# Patient Record
Sex: Female | Born: 1990 | Race: White | Hispanic: No | Marital: Married | Smoking: Current every day smoker
Health system: Southern US, Community
[De-identification: ages and names within clinical notes are randomized; demographics above are authoritative.]

## PROBLEM LIST (undated history)

## (undated) DIAGNOSIS — J45909 Unspecified asthma, uncomplicated: Secondary | ICD-10-CM

## (undated) HISTORY — PX: NO PAST SURGERIES: SHX2092

---

## 2009-10-19 ENCOUNTER — Emergency Department: Payer: Self-pay | Admitting: Emergency Medicine

## 2010-08-18 ENCOUNTER — Emergency Department: Payer: Self-pay | Admitting: Internal Medicine

## 2010-08-28 ENCOUNTER — Ambulatory Visit: Payer: Self-pay | Admitting: Internal Medicine

## 2010-10-03 ENCOUNTER — Emergency Department: Payer: Self-pay | Admitting: Emergency Medicine

## 2010-12-21 ENCOUNTER — Ambulatory Visit: Payer: Self-pay | Admitting: Internal Medicine

## 2011-09-04 ENCOUNTER — Emergency Department: Payer: Self-pay | Admitting: Emergency Medicine

## 2011-09-04 LAB — CBC
HGB: 11.8 g/dL — ABNORMAL LOW (ref 12.0–16.0)
MCH: 31.6 pg (ref 26.0–34.0)
MCHC: 34 g/dL (ref 32.0–36.0)
MCV: 93 fL (ref 80–100)
RBC: 3.73 10*6/uL — ABNORMAL LOW (ref 3.80–5.20)

## 2011-09-04 LAB — URINALYSIS, COMPLETE
Bilirubin,UR: NEGATIVE
Blood: NEGATIVE
Ketone: NEGATIVE
Ph: 6 (ref 4.5–8.0)
Protein: NEGATIVE
RBC,UR: 3 /HPF (ref 0–5)
Squamous Epithelial: 108

## 2011-09-04 LAB — HCG, QUANTITATIVE, PREGNANCY: Beta Hcg, Quant.: 622 m[IU]/mL — ABNORMAL HIGH

## 2012-04-05 ENCOUNTER — Emergency Department: Payer: Self-pay | Admitting: Emergency Medicine

## 2012-06-13 ENCOUNTER — Emergency Department: Payer: Self-pay | Admitting: Emergency Medicine
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
## 2013-02-09 ENCOUNTER — Inpatient Hospital Stay: Payer: Self-pay | Admitting: Psychiatry

## 2013-02-09 LAB — URINALYSIS, COMPLETE
Bilirubin,UR: NEGATIVE
Glucose,UR: NEGATIVE mg/dL (ref 0–75)
Ph: 6 (ref 4.5–8.0)
Protein: 30
Specific Gravity: 1.029 (ref 1.003–1.030)
Squamous Epithelial: 47

## 2013-02-09 LAB — COMPREHENSIVE METABOLIC PANEL
Albumin: 4 g/dL (ref 3.4–5.0)
Alkaline Phosphatase: 53 U/L
Anion Gap: 7 (ref 7–16)
Bilirubin,Total: 0.4 mg/dL (ref 0.2–1.0)
Calcium, Total: 9.4 mg/dL (ref 8.5–10.1)
Co2: 25 mmol/L (ref 21–32)
EGFR (African American): >60
EGFR (Non-African Amer.): >60
Glucose: 111 mg/dL — ABNORMAL HIGH (ref 65–99)
Potassium: 3.5 mmol/L (ref 3.5–5.1)
SGOT(AST): 25 U/L (ref 15–37)
SGPT (ALT): 24 U/L (ref 12–78)
Total Protein: 7.5 g/dL (ref 6.4–8.2)

## 2013-02-09 LAB — CBC
MCH: 31.2 pg (ref 26.0–34.0)
MCV: 90 fL (ref 80–100)
Platelet: 263 10*3/uL (ref 150–440)
RBC: 4.28 10*6/uL (ref 3.80–5.20)
WBC: 6 10*3/uL (ref 3.6–11.0)

## 2013-02-09 LAB — DRUG SCREEN, URINE
Barbiturates, Ur Screen: NEGATIVE (ref ?–200)
Benzodiazepine, Ur Scrn: POSITIVE (ref ?–200)
Cocaine Metabolite,Ur ~~LOC~~: NEGATIVE (ref ?–300)
MDMA (Ecstasy)Ur Screen: NEGATIVE (ref ?–500)
Methadone, Ur Screen: NEGATIVE (ref ?–300)
Opiate, Ur Screen: POSITIVE (ref ?–300)
Phencyclidine (PCP) Ur S: NEGATIVE (ref ?–25)

## 2013-02-09 LAB — ETHANOL
Ethanol %: <0.003 % (ref 0.000–0.080)
Ethanol: <3 mg/dL

## 2013-02-09 LAB — SALICYLATE LEVEL: Salicylates, Serum: 3.4 mg/dL — ABNORMAL HIGH

## 2013-02-09 LAB — ACETAMINOPHEN LEVEL: Acetaminophen: 2 ug/mL — ABNORMAL LOW

## 2013-02-10 LAB — BEHAVIORAL MEDICINE 1 PANEL
Albumin: 3.5 g/dL (ref 3.4–5.0)
Alkaline Phosphatase: 47 U/L
Anion Gap: 4 — ABNORMAL LOW (ref 7–16)
BUN: 11 mg/dL (ref 7–18)
Basophil #: 0 10*3/uL (ref 0.0–0.1)
Basophil %: 0.4 %
Bilirubin,Total: 0.5 mg/dL (ref 0.2–1.0)
Calcium, Total: 9.1 mg/dL (ref 8.5–10.1)
Chloride: 108 mmol/L — ABNORMAL HIGH (ref 98–107)
Co2: 30 mmol/L (ref 21–32)
Creatinine: 0.68 mg/dL (ref 0.60–1.30)
EGFR (African American): >60
EGFR (Non-African Amer.): >60
Eosinophil #: 0.3 10*3/uL (ref 0.0–0.7)
Eosinophil %: 5.2 %
Glucose: 95 mg/dL (ref 65–99)
HCT: 37.2 % (ref 35.0–47.0)
HGB: 12.8 g/dL (ref 12.0–16.0)
Lymphocyte #: 3 10*3/uL (ref 1.0–3.6)
Lymphocyte %: 45.6 %
MCH: 30.7 pg (ref 26.0–34.0)
MCHC: 34.4 g/dL (ref 32.0–36.0)
MCV: 89 fL (ref 80–100)
Monocyte #: 0.5 x10 3/mm (ref 0.2–0.9)
Monocyte %: 7.4 %
Neutrophil #: 2.7 10*3/uL (ref 1.4–6.5)
Neutrophil %: 41.4 %
Osmolality: 282 (ref 275–301)
Platelet: 225 10*3/uL (ref 150–440)
Potassium: 4 mmol/L (ref 3.5–5.1)
RBC: 4.16 10*6/uL (ref 3.80–5.20)
RDW: 12.7 % (ref 11.5–14.5)
SGOT(AST): 19 U/L (ref 15–37)
SGPT (ALT): 19 U/L (ref 12–78)
Sodium: 142 mmol/L (ref 136–145)
Thyroid Stimulating Horm: 1.34 u[IU]/mL
Total Protein: 6.7 g/dL (ref 6.4–8.2)
WBC: 6.5 10*3/uL (ref 3.6–11.0)

## 2013-03-03 ENCOUNTER — Encounter (HOSPITAL_COMMUNITY): Payer: Self-pay | Admitting: Emergency Medicine

## 2013-03-03 ENCOUNTER — Emergency Department (HOSPITAL_COMMUNITY)
Admission: EM | Admit: 2013-03-03 | Discharge: 2013-03-03 | Disposition: A | Discharge status: Home / Self Care | Payer: Self-pay | Attending: Emergency Medicine | Admitting: Emergency Medicine

## 2013-03-03 DIAGNOSIS — T7421XA Adult sexual abuse, confirmed, initial encounter: Secondary | ICD-10-CM

## 2013-03-03 DIAGNOSIS — F17200 Nicotine dependence, unspecified, uncomplicated: Secondary | ICD-10-CM | POA: Insufficient documentation

## 2013-03-03 DIAGNOSIS — IMO0002 Reserved for concepts with insufficient information to code with codable children: Secondary | ICD-10-CM | POA: Insufficient documentation

## 2013-03-03 DIAGNOSIS — T7491XA Unspecified adult maltreatment, confirmed, initial encounter: Secondary | ICD-10-CM | POA: Insufficient documentation

## 2013-03-03 DIAGNOSIS — T7492XA Unspecified child maltreatment, confirmed, initial encounter: Secondary | ICD-10-CM | POA: Insufficient documentation
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
## 2013-03-03 NOTE — ED Notes (Signed)
SANE Nurse at bedside 
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
## 2013-03-04 ENCOUNTER — Encounter (HOSPITAL_COMMUNITY): Payer: Self-pay | Admitting: Emergency Medicine

## 2013-03-04 ENCOUNTER — Emergency Department (HOSPITAL_COMMUNITY)
Admission: EM | Admit: 2013-03-04 | Discharge: 2013-03-04 | Disposition: A | Discharge status: Home / Self Care | Payer: Self-pay | Attending: Emergency Medicine | Admitting: Emergency Medicine

## 2013-03-04 DIAGNOSIS — S025XXA Fracture of tooth (traumatic), initial encounter for closed fracture: Secondary | ICD-10-CM | POA: Insufficient documentation

## 2013-03-04 DIAGNOSIS — K0889 Other specified disorders of teeth and supporting structures: Secondary | ICD-10-CM

## 2013-03-04 DIAGNOSIS — F17200 Nicotine dependence, unspecified, uncomplicated: Secondary | ICD-10-CM | POA: Insufficient documentation

## 2013-03-04 DIAGNOSIS — IMO0002 Reserved for concepts with insufficient information to code with codable children: Secondary | ICD-10-CM

## 2013-03-04 DIAGNOSIS — Z3202 Encounter for pregnancy test, result negative: Secondary | ICD-10-CM | POA: Insufficient documentation

## 2013-03-04 MED ORDER — LEVONORGESTREL 0.75 MG PO TABS
ORAL_TABLET | ORAL | Status: AC
  Administered 2013-03-05: 18:00:00
  Filled 2013-03-04: qty 2

## 2013-03-04 MED ORDER — TRAMADOL HCL 50 MG PO TABS
50.0000 mg | ORAL_TABLET | Freq: Once | ORAL | Status: AC
  Administered 2013-03-04: 50 mg via ORAL
  Filled 2013-03-04: qty 1

## 2013-03-04 MED ORDER — LEVONORGESTREL 0.75 MG PO TABS
1.5000 mg | ORAL_TABLET | Freq: Once | ORAL | Status: DC
  Filled 2013-03-04: qty 2

## 2013-03-04 MED ORDER — METRONIDAZOLE 500 MG PO TABS
2000.0000 mg | ORAL_TABLET | Freq: Once | ORAL | Status: DC
  Filled 2013-03-04: qty 4

## 2013-03-04 MED ORDER — CEFIXIME 400 MG PO TABS
400.0000 mg | ORAL_TABLET | Freq: Once | ORAL | Status: DC
  Filled 2013-03-04: qty 1

## 2013-03-04 MED ORDER — PENICILLIN V POTASSIUM 500 MG PO TABS: 500.0000 mg | ORAL_TABLET | Freq: Four times a day (QID) | ORAL | Status: DC

## 2013-03-04 MED ORDER — CEFIXIME 400 MG PO TABS
ORAL_TABLET | ORAL | Status: AC
  Administered 2013-03-05: 18:00:00
  Filled 2013-03-04: qty 1

## 2013-03-04 MED ORDER — PROMETHAZINE HCL 25 MG PO TABS
ORAL_TABLET | ORAL | Status: AC
  Administered 2013-03-05: 18:00:00
  Filled 2013-03-04: qty 3

## 2013-03-04 MED ORDER — AZITHROMYCIN 1 G PO PACK
1.0000 g | PACK | Freq: Once | ORAL | Status: DC
  Filled 2013-03-04: qty 1

## 2013-03-04 MED ORDER — AZITHROMYCIN 1 G PO PACK
PACK | ORAL | Status: AC
  Filled 2013-03-04: qty 1

## 2013-03-04 MED ORDER — HYDROCODONE-ACETAMINOPHEN 5-325 MG PO TABS: 1.0000 | ORAL_TABLET | ORAL | Status: DC | PRN

## 2013-03-04 MED ORDER — PROMETHAZINE HCL 25 MG PO TABS: 25.0000 mg | ORAL_TABLET | Freq: Four times a day (QID) | ORAL | Status: DC | PRN

## 2013-03-04 MED ORDER — PROMETHAZINE HCL 25 MG PO TABS
25.0000 mg | ORAL_TABLET | Freq: Four times a day (QID) | ORAL | Status: DC | PRN
  Filled 2013-03-04: qty 1

## 2013-03-04 MED ORDER — METRONIDAZOLE 500 MG PO TABS
ORAL_TABLET | ORAL | Status: AC
  Administered 2013-03-05: 18:00:00
  Filled 2013-03-04: qty 4
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
## 2013-03-04 NOTE — ED Notes (Signed)
SANE RN at the bedside with patient.
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
## 2013-03-04 NOTE — ED Notes (Signed)
Pt becoming agitated and asking for the nurse. Offered to assist patient. She wants to know how much longer it would be until the sane nurse arrived. Sane RN repaged. States she's in the building and will be down shortly. Pt updated on plan of care. Calm/cooperative at present.

## 2013-03-04 NOTE — ED Notes (Signed)
Notified Psychologist, clinicalane RN
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
## 2013-03-04 NOTE — ED Notes (Signed)
SANE RN paged and notified pt is here.

## 2013-03-04 NOTE — ED Notes (Signed)
SANE RNs given sane med kit pyxis.  

## 2013-03-04 NOTE — ED Provider Notes (Signed)
Medical screening examination/treatment/procedure(s) were performed by non-physician practitioner and as supervising physician I was immediately available for consultation/collaboration.  EKG Interpretation   None        Ilhan Debenedetto K Marylon Verno-Rasch, MD 03/04/13 [id number]
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
## 2013-03-05 NOTE — ED Provider Notes (Signed)
Medical screening examination/treatment/procedure(s) were performed by non-physician practitioner and as supervising physician I was immediately available for consultation/collaboration.  EKG Interpretation   None         Shanna CiscoMegan E Docherty, MD 03/05/13 1110

## 2013-03-08 LAB — POCT PREGNANCY, URINE: PREG TEST UR: NEGATIVE
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
## 2014-01-20 ENCOUNTER — Emergency Department: Payer: Self-pay | Admitting: Emergency Medicine

## 2014-05-05 ENCOUNTER — Emergency Department: Payer: Self-pay | Admitting: Emergency Medicine
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
## 2016-05-15 ENCOUNTER — Emergency Department
Admission: EM | Admit: 2016-05-15 | Discharge: 2016-05-15 | Disposition: A | Discharge status: Home / Self Care | Payer: Self-pay | Attending: Emergency Medicine | Admitting: Emergency Medicine

## 2016-05-15 ENCOUNTER — Encounter: Payer: Self-pay | Admitting: *Deleted

## 2016-05-15 DIAGNOSIS — J111 Influenza due to unidentified influenza virus with other respiratory manifestations: Secondary | ICD-10-CM | POA: Insufficient documentation

## 2016-05-15 DIAGNOSIS — F17210 Nicotine dependence, cigarettes, uncomplicated: Secondary | ICD-10-CM | POA: Insufficient documentation

## 2016-05-15 MED ORDER — ONDANSETRON 4 MG PO TBDP: 4.0000 mg | ORAL_TABLET | Freq: Four times a day (QID) | ORAL | 0 refills | Status: DC | PRN

## 2016-05-15 MED ORDER — FLUTICASONE PROPIONATE 50 MCG/ACT NA SUSP: 2.0000 | Freq: Every day | NASAL | 0 refills | Status: DC

## 2016-05-15 MED ORDER — ACETAMINOPHEN-CODEINE #3 300-30 MG PO TABS: 1.0000 | ORAL_TABLET | Freq: Four times a day (QID) | ORAL | 0 refills | Status: DC | PRN

## 2016-05-15 MED ORDER — BENZONATATE 100 MG PO CAPS: ORAL_CAPSULE | ORAL | 0 refills | Status: DC

## 2016-05-15 NOTE — ED Triage Notes (Addendum)
Patient c/o fever, vomiting, coughing, general body aches since yesterday. Patient reports temp 102 at 2200 last night. Patient reports taken 800 IBU PO x3 since last night.

## 2016-05-15 NOTE — Discharge Instructions (Signed)
Your symptoms are consistent with influenza (the flu). You should continue to treat fevers with OTC Tylenol and Motrin. Take OTC Delsym (dextromethorphan) for cough, Claritin for runny nose; pseudoephedrine for sinus congestion; and the prescription meds as directed. Continue to hydrate to prevent dehydration. [facility] or return for worsening symptoms.

## 2016-05-15 NOTE — ED Provider Notes (Signed)
[facility] Emergency Department Provider Note ____________________________________________  Time seen: 1309  I have reviewed the triage vital signs and the nursing notes.  HISTORY  Chief Complaint  Fever  HPI Kendra Lindsey is a 26 y.o. female presents to the ED with complaints of fevers, coughing, generalized bodyaches, and vomiting since yesterday. Patient also reports a Tmax of 102F last night. She has been taking ibuprofen on schedule since onset of fevers last night. She does notendorse receiving the seasonal flu vaccine. She does admit however to sick contacts with influenza. She presents now for evaluation of flulike symptoms.  History reviewed. No pertinent past medical history.  There are no active problems to display for this patient.  History reviewed. No pertinent surgical history.  Prior to Admission medications   Medication Sig Start Date End Date Taking? Authorizing Provider  acetaminophen-codeine (TYLENOL #3) 300-30 MG tablet Take 1 tablet by mouth every 6 (six) hours as needed for moderate pain. 05/15/16   Yovana Scogin V Bacon Yvett Rossel, PA-C  benzonatate (TESSALON PERLES) 100 MG capsule Take 1-2 tabs TID prn cough 05/15/16   Florian Chauca V Bacon Janecia Palau, PA-C  fluticasone (FLONASE) 50 MCG/ACT nasal spray Place 2 sprays into both nostrils daily. 05/15/16   Daxten Kovalenko V Bacon Zyon Rosser, PA-C  HYDROcodone-acetaminophen (NORCO/VICODIN) 5-325 MG per tablet Take 1-2 tablets by mouth every 4 (four) hours as needed. 03/04/13   Junious Silk, PA-C  ondansetron (ZOFRAN ODT) 4 MG disintegrating tablet Take 1 tablet (4 mg total) by mouth every 6 (six) hours as needed for nausea or vomiting. 05/15/16   Charlesetta Ivory Treysen Sudbeck, PA-C  penicillin v potassium (VEETID) 500 MG tablet Take 1 tablet (500 mg total) by mouth 4 (four) times daily. 03/04/13   Junious Silk, PA-C  promethazine (PHENERGAN) 25 MG tablet Take 1 tablet (25 mg total) by mouth every 6 (six) hours as needed for nausea  or vomiting. 03/04/13   Junious Silk, PA-C    Allergies Patient has no known allergies.  No family history on file.  Social History Social History  Substance Use Topics  . Smoking status: Current Every Day Smoker    Packs/day: 0.25    Types: Cigarettes  . Smokeless tobacco: Never Used  . Alcohol use Yes     Comment: socially    Review of Systems  Constitutional: Positive for fever. Eyes: Negative for visual changes. ENT: Negative for sore throat. Cardiovascular: Negative for chest pain. Respiratory: Negative for shortness of breath. Gastrointestinal: Negative for abdominal pain and diarrhea. Reports vomiting. Genitourinary: Negative for dysuria. Musculoskeletal: Negative for back pain. Reports generalized bodyaches. Skin: Negative for rash. Neurological: Negative for headaches, focal weakness or numbness. ____________________________________________  PHYSICAL EXAM:  VITAL SIGNS: ED Triage Vitals  Enc Vitals Group     BP 05/15/16 1308 136/80     Pulse Rate 05/15/16 1308 (!) 103     Resp 05/15/16 1308 18     Temp 05/15/16 1308 98.6 F (37 C)     Temp Source 05/15/16 1308 Oral     SpO2 05/15/16 1308 98 %     Weight 05/15/16 1311 200 lb (90.7 kg)     Height 05/15/16 1311 5\' 3"  (1.6 m)     Head Circumference --      Peak Flow --      Pain Score 05/15/16 1311 9     Pain Loc --      Pain Edu? --      Excl. in GC? --  Constitutional: Alert and oriented. Well appearing and in no distress. Head: Normocephalic and atraumatic. Eyes: Conjunctivae are normal. PERRL. Normal extraocular movements Ears: Canals clear. TMs intact bilaterally. Nose: No congestion/rhinorrhea/epistaxis. Mouth/Throat: Mucous membranes are moist. Uvula is midline and tonsils are flat. No oropharyngeal lesions, erythema, or edema noted.  Neck: Supple. No thyromegaly. Hematological/Lymphatic/Immunological: No cervical lymphadenopathy. Cardiovascular: Normal rate, regular rhythm. Normal distal  pulses. Respiratory: Normal respiratory effort. No wheezes/rales/rhonchi. Gastrointestinal: Soft and nontender. No distention. Musculoskeletal: Nontender with normal range of motion in all extremities.  ____________________________________________  INITIAL IMPRESSION / ASSESSMENT AND PLAN / ED COURSE  Patient with a clinical presentation consistent with influenza. There are no signs of dehydration or acute respiratory distress. She will be discharged with prescriptions for Tylenol #3, Tessalon Perles, Flonase, and Zofran ODT. She is advised to start OTC allergy medicine, decongestants, and continued antipyretics. [facility] or return to the ED as needed. ____________________________________________  FINAL CLINICAL IMPRESSION(S) / ED DIAGNOSES  Final diagnoses:  Influenza      Kendra HoardJenise V Bacon Kendra Coltrane, PA-C 05/15/16 1736    Minna AntisKevin Paduchowski, MD 05/16/16 2044

## 2016-06-05 ENCOUNTER — Ambulatory Visit
Admission: EM | Admit: 2016-06-05 | Discharge: 2016-06-05 | Disposition: A | Discharge status: Home / Self Care | Payer: Self-pay | Attending: Family Medicine | Admitting: Family Medicine

## 2016-06-05 ENCOUNTER — Encounter: Payer: Self-pay | Admitting: *Deleted

## 2016-06-05 DIAGNOSIS — H6693 Otitis media, unspecified, bilateral: Secondary | ICD-10-CM

## 2016-06-05 DIAGNOSIS — R059 Cough, unspecified: Secondary | ICD-10-CM

## 2016-06-05 DIAGNOSIS — B9689 Other specified bacterial agents as the cause of diseases classified elsewhere: Secondary | ICD-10-CM

## 2016-06-05 DIAGNOSIS — R05 Cough: Secondary | ICD-10-CM

## 2016-06-05 DIAGNOSIS — N760 Acute vaginitis: Secondary | ICD-10-CM

## 2016-06-05 LAB — CHLAMYDIA/NGC RT PCR (ARMC ONLY)
CHLAMYDIA TR: NOT DETECTED
N gonorrhoeae: NOT DETECTED

## 2016-06-05 LAB — WET PREP, GENITAL
Sperm: NONE SEEN
Trich, Wet Prep: NONE SEEN
Yeast Wet Prep HPF POC: NONE SEEN

## 2016-06-05 MED ORDER — AZITHROMYCIN 250 MG PO TABS: ORAL_TABLET | ORAL | 0 refills | Status: DC

## 2016-06-05 MED ORDER — HYDROCOD POLST-CPM POLST ER 10-8 MG/5ML PO SUER: 5.0000 mL | Freq: Two times a day (BID) | ORAL | 0 refills | Status: DC | PRN

## 2016-06-05 MED ORDER — METRONIDAZOLE 500 MG PO TABS: 500.0000 mg | ORAL_TABLET | Freq: Two times a day (BID) | ORAL | 0 refills | Status: DC

## 2016-06-05 NOTE — ED Triage Notes (Signed)
Productive cough- green, fever, runny nose, head congestion, body aches, x several days. OTC meds not helping. Also, pt c/o vaginal discharge, denies dysuria.

## 2016-06-05 NOTE — ED Provider Notes (Signed)
[facility]    CSN: [id number] Arrival date & time: 06/05/16  1110     History   Chief Complaint Chief Complaint  Patient presents with  . Cough  . Fever  . Nasal Congestion  . Vaginal Discharge    HPI Kendra Lindsey is a 26 y.o. female.   The history is provided by the patient.  Cough  Associated symptoms: fever and rhinorrhea   Associated symptoms: no headaches and no wheezing   Fever  Associated symptoms: congestion, cough and rhinorrhea   Associated symptoms: no dysuria, no headaches, no nausea and no vomiting   Vaginal Discharge  Quality:  White and thick Severity:  Moderate Onset quality:  Sudden Duration:  1 week Timing:  Constant Progression:  Unchanged Chronicity:  New Context: spontaneously   Context: not after intercourse, not after urination, not at rest, not during pregnancy, not during urination, not genital trauma and not recent antibiotic use   Relieved by:  None tried Associated symptoms: fever   Associated symptoms: no abdominal pain, no dyspareunia, no dysuria, no genital lesions, no nausea, no rash, no urinary frequency, no urinary hesitancy, no urinary incontinence, no vaginal itching and no vomiting   Risk factors: no foreign body   URI  Presenting symptoms: congestion, cough, fever and rhinorrhea   Severity:  Moderate Onset quality:  Sudden Duration:  5 days Timing:  Constant Progression:  Worsening Chronicity:  New Relieved by:  Nothing Ineffective treatments:  OTC medications Associated symptoms: no headaches, no sinus pain and no wheezing   Risk factors: not elderly, no chronic cardiac disease, no chronic kidney disease, no chronic respiratory disease, no diabetes mellitus, no immunosuppression, no recent illness, no recent travel and no sick contacts     History reviewed. No pertinent past medical history.  There are no active problems to display for this patient.   History reviewed. No pertinent surgical  history.  OB History    No data available       Home Medications    Prior to Admission medications   Medication Sig Start Date End Date Taking? Authorizing Provider  acetaminophen-codeine (TYLENOL #3) 300-30 MG tablet Take 1 tablet by mouth every 6 (six) hours as needed for moderate pain. 05/15/16   Jenise V Bacon Menshew, PA-C  azithromycin (ZITHROMAX Z-PAK) 250 MG tablet 2 tabs po once day 1, then 1 tab po qd for next 4 days 06/05/16   Payton Mccallum, MD  azithromycin (ZITHROMAX Z-PAK) 250 MG tablet 2 tabs po once day 1, then 1 tab po qd for next 4 days 06/05/16   Payton Mccallum, MD  benzonatate (TESSALON PERLES) 100 MG capsule Take 1-2 tabs TID prn cough 05/15/16   Jenise V Bacon Menshew, PA-C  chlorpheniramine-HYDROcodone (TUSSIONEX PENNKINETIC ER) 10-8 MG/5ML SUER Take 5 mLs by mouth every 12 (twelve) hours as needed. 06/05/16   Payton Mccallum, MD  chlorpheniramine-HYDROcodone (TUSSIONEX PENNKINETIC ER) 10-8 MG/5ML SUER Take 5 mLs by mouth every 12 (twelve) hours as needed. 06/05/16   Payton Mccallum, MD  fluticasone (FLONASE) 50 MCG/ACT nasal spray Place 2 sprays into both nostrils daily. 05/15/16   Jenise V Bacon Menshew, PA-C  HYDROcodone-acetaminophen (NORCO/VICODIN) 5-325 MG per tablet Take 1-2 tablets by mouth every 4 (four) hours as needed. 03/04/13   Junious Silk, PA-C  metroNIDAZOLE (FLAGYL) 500 MG tablet Take 1 tablet (500 mg total) by mouth 2 (two) times daily. 06/05/16   Payton Mccallum, MD  ondansetron (ZOFRAN ODT) 4 MG disintegrating tablet Take 1  tablet (4 mg total) by mouth every 6 (six) hours as needed for nausea or vomiting. 05/15/16   Charlesetta Ivory Menshew, PA-C  penicillin v potassium (VEETID) 500 MG tablet Take 1 tablet (500 mg total) by mouth 4 (four) times daily. 03/04/13   Junious Silk, PA-C  promethazine (PHENERGAN) 25 MG tablet Take 1 tablet (25 mg total) by mouth every 6 (six) hours as needed for nausea or vomiting. 03/04/13   Junious Silk, PA-C    Family History History  reviewed. No pertinent family history.  Social History Social History  Substance Use Topics  . Smoking status: Current Every Day Smoker    Packs/day: 0.25    Types: Cigarettes  . Smokeless tobacco: Never Used  . Alcohol use Yes     Comment: socially     Allergies   Patient has no known allergies.   Review of Systems Review of Systems  Constitutional: Positive for fever.  HENT: Positive for congestion and rhinorrhea. Negative for sinus pain.   Respiratory: Positive for cough. Negative for wheezing.   Gastrointestinal: Negative for abdominal pain, nausea and vomiting.  Genitourinary: Positive for vaginal discharge. Negative for bladder incontinence, dyspareunia, dysuria and hesitancy.  Neurological: Negative for headaches.     Physical Exam Triage Vital Signs ED Triage Vitals  Enc Vitals Group     BP 06/05/16 1133 133/85     Pulse Rate 06/05/16 1133 98     Resp 06/05/16 1133 16     Temp 06/05/16 1133 99.4 F (37.4 C)     Temp Source 06/05/16 1133 Oral     SpO2 06/05/16 1133 99 %     Weight 06/05/16 1135 200 lb (90.7 kg)     Height 06/05/16 1135  (1.6 m)     Head Circumference --      Peak Flow --      Pain Score 06/05/16 1301 9     Pain Loc --      Pain Edu? --      Excl. in GC? --    No data found.   Updated Vital Signs BP 133/85 (BP Location: Left Arm)   Pulse 98   Temp 99.4 F (37.4 C) (Oral)   Resp 16   Ht  (1.6 m)   Wt 200 lb (90.7 kg)   LMP 05/04/2016 (Exact Date)   SpO2 99%   BMI 35.43 kg/m   Visual Acuity Right Eye Distance:   Left Eye Distance:   Bilateral Distance:    Right Eye Near:   Left Eye Near:    Bilateral Near:     Physical Exam  Constitutional: She appears well-developed and well-nourished. No distress.  HENT:  Head: Normocephalic and atraumatic.  Right Ear: External ear and ear canal normal. Tympanic membrane is erythematous and bulging.  Left Ear: External ear and ear canal normal. Tympanic membrane is  erythematous and bulging.  Nose: No mucosal edema, rhinorrhea, nose lacerations, sinus tenderness, nasal deformity, septal deviation or nasal septal hematoma. No epistaxis.  No foreign bodies. Right sinus exhibits no maxillary sinus tenderness and no frontal sinus tenderness. Left sinus exhibits no maxillary sinus tenderness and no frontal sinus tenderness.  Mouth/Throat: Uvula is midline, oropharynx is clear and moist and mucous membranes are normal. No oropharyngeal exudate.  Eyes: Conjunctivae and EOM are normal. Pupils are equal, round, and reactive to light. Right eye exhibits no discharge. Left eye exhibits no discharge. No scleral icterus.  Neck: Normal range of motion. Neck supple.  No thyromegaly present.  Cardiovascular: Normal rate, regular rhythm and normal heart sounds.   Pulmonary/Chest: Effort normal and breath sounds normal. No respiratory distress. She has no wheezes. She has no rales.  Genitourinary: Pelvic exam was performed with patient supine. Cervix exhibits no discharge and no friability. Vaginal discharge found.  Lymphadenopathy:    She has no cervical adenopathy.  Skin: She is not diaphoretic.  Nursing note and vitals reviewed.    UC Treatments / Results  Labs (all labs ordered are listed, but only abnormal results are displayed) Labs Reviewed  WET PREP, GENITAL - Abnormal; Notable for the following:       Result Value   Clue Cells Wet Prep HPF POC PRESENT (*)    WBC, Wet Prep HPF POC MODERATE (*)    All other components within normal limits  CHLAMYDIA/NGC RT PCR [facility] ONLY)    EKG  EKG Interpretation None       Radiology No results found.  Procedures Procedures (including critical care time)  Medications Ordered in UC Medications - No data to display   Initial Impression / Assessment and Plan / UC Course  I have reviewed the triage vital signs and the nursing notes.  Pertinent labs & imaging results that were available during my care  of the patient were reviewed by me and considered in my medical decision making (see chart for details).       Final Clinical Impressions(s) / UC Diagnoses   Final diagnoses:  BV (bacterial vaginosis)  Acute otitis media, bilateral  Cough    New Prescriptions Discharge Medication List as of 06/05/2016 12:52 PM    START taking these medications   Details  !! chlorpheniramine-HYDROcodone (TUSSIONEX PENNKINETIC ER) 10-8 MG/5ML SUER Take 5 mLs by mouth every 12 (twelve) hours as needed., Starting Wed 06/05/2016, Normal    !! chlorpheniramine-HYDROcodone (TUSSIONEX PENNKINETIC ER) 10-8 MG/5ML SUER Take 5 mLs by mouth every 12 (twelve) hours as needed., Starting Wed 06/05/2016, Normal    metroNIDAZOLE (FLAGYL) 500 MG tablet Take 1 tablet (500 mg total) by mouth 2 (two) times daily., Starting Wed 06/05/2016, Normal     !! - Potential duplicate medications found. Please discuss with provider.     1. Lab results and diagnosis reviewed with patient 2. rx as per orders above; reviewed possible side effects, interactions, risks and benefits  3. Recommend supportive treatment with increased fluids, otc analgesics prn 4. Follow-up prn if symptoms worsen or don't improve   Payton Mccallum, MD 06/05/16 1800

## 2016-06-06 ENCOUNTER — Telehealth: Payer: Self-pay

## 2016-06-06 NOTE — Telephone Encounter (Signed)
Pt was informed of negative test results 

## 2016-06-06 NOTE — Telephone Encounter (Signed)
-----   Message from Payton Mccallum, MD sent at 06/05/2016  8:43 PM EDT ----- Notify patient tests were negative

## 2016-06-06 NOTE — Telephone Encounter (Signed)
Called to talk with patient about test results, voicemail hasn't been set up and other number is someone else's.

## 2016-11-15 ENCOUNTER — Emergency Department: Payer: Self-pay

## 2016-11-15 ENCOUNTER — Emergency Department
Admission: EM | Admit: 2016-11-15 | Discharge: 2016-11-15 | Disposition: A | Discharge status: Home / Self Care | Payer: Self-pay | Attending: Emergency Medicine | Admitting: Emergency Medicine

## 2016-11-15 ENCOUNTER — Encounter: Payer: Self-pay | Admitting: Emergency Medicine

## 2016-11-15 DIAGNOSIS — N898 Other specified noninflammatory disorders of vagina: Secondary | ICD-10-CM | POA: Insufficient documentation

## 2016-11-15 DIAGNOSIS — R509 Fever, unspecified: Secondary | ICD-10-CM | POA: Insufficient documentation

## 2016-11-15 DIAGNOSIS — R102 Pelvic and perineal pain: Secondary | ICD-10-CM | POA: Insufficient documentation

## 2016-11-15 DIAGNOSIS — H9203 Otalgia, bilateral: Secondary | ICD-10-CM | POA: Insufficient documentation

## 2016-11-15 DIAGNOSIS — R197 Diarrhea, unspecified: Secondary | ICD-10-CM | POA: Insufficient documentation

## 2016-11-15 DIAGNOSIS — R112 Nausea with vomiting, unspecified: Secondary | ICD-10-CM | POA: Insufficient documentation

## 2016-11-15 LAB — CBC
HCT: 37.2 % (ref 35.0–47.0)
Hemoglobin: 13 g/dL (ref 12.0–16.0)
MCH: 31.8 pg (ref 26.0–34.0)
MCHC: 35 g/dL (ref 32.0–36.0)
MCV: 90.8 fL (ref 80.0–100.0)
Platelets: 270 10*3/uL (ref 150–440)
RBC: 4.1 MIL/uL (ref 3.80–5.20)
RDW: 13.4 % (ref 11.5–14.5)
WBC: 7.2 10*3/uL (ref 3.6–11.0)

## 2016-11-15 LAB — COMPREHENSIVE METABOLIC PANEL
ALBUMIN: 4 g/dL (ref 3.5–5.0)
ALK PHOS: 46 U/L (ref 38–126)
ALT: 23 U/L (ref 14–54)
ANION GAP: 7 (ref 5–15)
AST: 27 U/L (ref 15–41)
BILIRUBIN TOTAL: 0.6 mg/dL (ref 0.3–1.2)
BUN: 14 mg/dL (ref 6–20)
CALCIUM: 9.1 mg/dL (ref 8.9–10.3)
CO2: 26 mmol/L (ref 22–32)
CREATININE: 0.77 mg/dL (ref 0.44–1.00)
Chloride: 108 mmol/L (ref 101–111)
GFR calc Af Amer: >60 mL/min (ref >60)
GFR calc non Af Amer: >60 mL/min (ref >60)
GLUCOSE: 90 mg/dL (ref 65–99)
Potassium: 3.5 mmol/L (ref 3.5–5.1)
Sodium: 141 mmol/L (ref 135–145)
TOTAL PROTEIN: 7.2 g/dL (ref 6.5–8.1)

## 2016-11-15 LAB — POCT PREGNANCY, URINE: PREG TEST UR: NEGATIVE

## 2016-11-15 LAB — LIPASE, BLOOD: Lipase: 23 U/L (ref 11–51)

## 2016-11-15 MED ORDER — ONDANSETRON 4 MG PO TBDP
4.0000 mg | ORAL_TABLET | Freq: Once | ORAL | Status: AC | PRN
  Administered 2016-11-15: 4 mg via ORAL
  Filled 2016-11-15: qty 1

## 2016-11-15 NOTE — ED Triage Notes (Addendum)
1) Pt c/o cough x1 month, had URI x 3 months ago.  Pt c/o fevers and bilateral ear pain. Pt states she has sinus pressure and productive cough.  Pt has mask on.  Fever at home 101. Took Ibuprofen  at 10am.  Shortness of breath reported during coughing spells.  2) Pt also c/o diarrhea and vomiting x 2 days. Pt states she is unable to keep liquids down. Pt states she is having constant nausea.  3) Pt also c/o vaginal pain, pt had BV pt c/o vaginal discharge as well with foul odor. Pt states she recent unprotected sex with ex-husband.  Pt is here with her girlfriend.

## 2016-11-16 ENCOUNTER — Encounter: Payer: Self-pay | Admitting: Emergency Medicine

## 2016-11-16 ENCOUNTER — Emergency Department
Admission: EM | Admit: 2016-11-16 | Discharge: 2016-11-16 | Discharge status: Against Medical Advice | Payer: Self-pay | Attending: Emergency Medicine | Admitting: Emergency Medicine

## 2016-11-16 DIAGNOSIS — R198 Other specified symptoms and signs involving the digestive system and abdomen: Secondary | ICD-10-CM | POA: Insufficient documentation

## 2016-11-16 DIAGNOSIS — R519 Headache, unspecified: Secondary | ICD-10-CM

## 2016-11-16 DIAGNOSIS — R39198 Other difficulties with micturition: Secondary | ICD-10-CM | POA: Insufficient documentation

## 2016-11-16 DIAGNOSIS — F17210 Nicotine dependence, cigarettes, uncomplicated: Secondary | ICD-10-CM | POA: Insufficient documentation

## 2016-11-16 DIAGNOSIS — R51 Headache: Secondary | ICD-10-CM | POA: Insufficient documentation

## 2016-11-16 DIAGNOSIS — Z79899 Other long term (current) drug therapy: Secondary | ICD-10-CM | POA: Insufficient documentation

## 2016-11-16 DIAGNOSIS — J40 Bronchitis, not specified as acute or chronic: Secondary | ICD-10-CM | POA: Insufficient documentation

## 2016-11-16 DIAGNOSIS — N760 Acute vaginitis: Secondary | ICD-10-CM | POA: Insufficient documentation

## 2016-11-16 LAB — CHLAMYDIA/NGC RT PCR (ARMC ONLY)
CHLAMYDIA TR: NOT DETECTED
N GONORRHOEAE: NOT DETECTED

## 2016-11-16 LAB — WET PREP, GENITAL
Clue Cells Wet Prep HPF POC: NONE SEEN
SPERM: NONE SEEN
TRICH WET PREP: NONE SEEN
Yeast Wet Prep HPF POC: NONE SEEN

## 2016-11-16 MED ORDER — PREDNISONE 10 MG PO TABS: ORAL_TABLET | ORAL | 0 refills | Status: DC

## 2016-11-16 MED ORDER — KETOROLAC TROMETHAMINE 30 MG/ML IJ SOLN
30.0000 mg | Freq: Once | INTRAMUSCULAR | Status: DC
Start: 2016-11-16 — End: 2016-11-16
  Filled 2016-11-16: qty 1

## 2016-11-16 MED ORDER — METRONIDAZOLE 500 MG PO TABS: 500.0000 mg | ORAL_TABLET | Freq: Two times a day (BID) | ORAL | 0 refills | Status: AC

## 2016-11-16 MED ORDER — DOXYCYCLINE HYCLATE 50 MG PO CAPS: 100.0000 mg | ORAL_CAPSULE | Freq: Two times a day (BID) | ORAL | 0 refills | Status: AC

## 2016-11-16 MED ORDER — KETOROLAC TROMETHAMINE 30 MG/ML IJ SOLN
30.0000 mg | Freq: Once | INTRAMUSCULAR | Status: AC
  Administered 2016-11-16: 30 mg via INTRAMUSCULAR

## 2016-11-16 MED ORDER — ONDANSETRON 4 MG PO TBDP
4.0000 mg | ORAL_TABLET | Freq: Once | ORAL | Status: AC
  Administered 2016-11-16: 4 mg via ORAL
  Filled 2016-11-16: qty 1

## 2016-11-16 MED ORDER — ALBUTEROL SULFATE HFA 108 (90 BASE) MCG/ACT IN AERS: 2.0000 | INHALATION_SPRAY | Freq: Four times a day (QID) | RESPIRATORY_TRACT | 0 refills | Status: DC | PRN

## 2016-11-16 MED ORDER — MELOXICAM 15 MG PO TABS: 15.0000 mg | ORAL_TABLET | Freq: Every day | ORAL | 0 refills | Status: AC

## 2016-11-16 NOTE — ED Notes (Signed)
Gave patient ice water.

## 2016-11-16 NOTE — ED Notes (Addendum)
Pt presents to ED with c/o vomiting and diarrhea x 3 days, states has been happening at the same time for the last 3 days, states in the last 24 hours states has had diarrhea 10 times and 2 episodes of vomiting, and also reports constant nausea. Pt also reports cough x 3-4 weeks that is productive with green and yellow phlegm. Pt states 2.5 months ago had URI and double ear infection, states she is unsure if that is resolved because she did not go to follow up appt. Pt states daily migraines with photosensitivity, states has been sent home 3 times in last week. Pt also reports dysuria with dark and foul smelling urine, pt states she doesn't drink much water and thinks she may have UTI. 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 Pt states that her girlfriend attempted to reproduce the "chandelier affect" that the doctor told her she had the last time she had PID from bacterial vaginosis, pt also states she thinks she has oral thrush due to a thick coating she feels is on her tongue and due to the abx from BV and her URI. Pt states a coworker was recently dx with staph infection and she has red bumps on her legs and pt is now concerned that she has staph infection.

## 2016-11-16 NOTE — ED Notes (Signed)
Pt bladder scanned, 157cc's of urine recorded in patient's bladder. Pt refusing in and out cath but states that she cannot pee. Belenda Cruise, RN at bedside at this time with this RN. This RN discussed with patient that the last thing we were waiting on was urine and that per PA, a UA was needed. Pt continued to refuse to allow for in and out cath but states, "why do I have pee in there but I can't pee?" This RN was unable to explain to patient why she was not able to urinate but that a specimen was needed. Pt states "I'm sorry but I don't want a catheter".

## 2016-11-16 NOTE — ED Notes (Signed)
NAD noted at time of D/C. Pt denies questions or concerns. Pt ambulatory to the lobby at this time. This RN discussed AMA paper with patient at this time, discussed leaving AMA could result in injury up to and including death. Pt states understanding.

## 2016-11-16 NOTE — ED Notes (Signed)
Talked to pt about needing urine and how important it is for the lab to run test and make sure everything is functioning properly and there is no infection. Pt refuses to allow anyone to in and out cath her and states she cannot pee and has already tried. Pt states she has to pick up her kids and needs to leave because her girlfriend has to work late. Pt is aware she will sign AMA and pt stated yes she knows, she done the same thing yesterday. Pt wants other prescriptions that PA is writing. I informed pt I would let PA know she wants to leave.

## 2016-11-16 NOTE — ED Triage Notes (Addendum)
Pt has multiple complaints.  C/o cough and congestion, lungs burning, pelvic pain, inflammation of cervix.  "I am worried about staph because I get these little bump things".  Pt worried may have UTI.  C/o migraines.  Also wants to be seen for possible urine infection.  Here yesterday and left before seen by MD because couldn't wait.  Labs and xray done yesterday. No UA done, will order in triage.  "the most painful part though is my cervix". POC was done yesterday. NAD. VSS. Ambulatory without difficulty. respirations unlabored. Reports migraines used to be once week and now once every couple days.

## 2016-11-16 NOTE — ED Provider Notes (Signed)
[facility] Emergency Department Provider Note  ____________________________________________  Time seen: Approximately 11:20 AM  I have reviewed the triage vital signs and the nursing notes.   HISTORY  Chief Complaint Cough and Other (vaginal discharge)    HPI Kendra Lindsey is a 26 y.o. female that presents to the emergency department for evaluation of multiple concerns.Patient states that she has had a nonproductive cough, bilateral ear pain for 1 month. She was recently treated about 2 months ago for a double ear infection. She also has had dysuria and urgency of urination for one month. Her urine is much darker than normal but there is no blood. She is concerned that she has a vaginal infection because her girlfriend touched her cervix and it was painful. She has a history of PID. She is also having headaches that wrap around her head more frequent than usual. They are throbbing in character. Headaches feel the same as usual, they are just more frequent. She frequently has chills and is having them right now. She is more tired than normal for at least 2 months. She has vomited several times in the last 3 days and has had several episodes of nonbloody diarrhea. She feels nauseous currently. She does not recall eating any suspicious foods. No sick contacts. She is on house arrest and has to pick up her children today.    History reviewed. No pertinent past medical history.  There are no active problems to display for this patient.   History reviewed. No pertinent surgical history.  Prior to Admission medications   Medication Sig Start Date End Date Taking? Authorizing Provider  acetaminophen-codeine (TYLENOL #3) 300-30 MG tablet Take 1 tablet by mouth every 6 (six) hours as needed for moderate pain. 05/15/16   Menshew, Charlesetta Ivory, PA-C  albuterol (PROVENTIL HFA;VENTOLIN HFA) 108 (90 Base) MCG/ACT inhaler Inhale 2 puffs into the lungs every 6 (six) hours as  needed for wheezing or shortness of breath. 11/16/16   Enid Derry, PA-C  azithromycin (ZITHROMAX Z-PAK) 250 MG tablet 2 tabs po once day 1, then 1 tab po qd for next 4 days 06/05/16   Payton Mccallum, MD  azithromycin (ZITHROMAX Z-PAK) 250 MG tablet 2 tabs po once day 1, then 1 tab po qd for next 4 days 06/05/16   Payton Mccallum, MD  benzonatate (TESSALON PERLES) 100 MG capsule Take 1-2 tabs TID prn cough 05/15/16   Menshew, Charlesetta Ivory, PA-C  chlorpheniramine-HYDROcodone (TUSSIONEX PENNKINETIC ER) 10-8 MG/5ML SUER Take 5 mLs by mouth every 12 (twelve) hours as needed. 06/05/16   Payton Mccallum, MD  chlorpheniramine-HYDROcodone (TUSSIONEX PENNKINETIC ER) 10-8 MG/5ML SUER Take 5 mLs by mouth every 12 (twelve) hours as needed. 06/05/16   Payton Mccallum, MD  doxycycline (VIBRAMYCIN) 50 MG capsule Take 2 capsules (100 mg total) by mouth 2 (two) times daily. 11/16/16 11/26/16  Enid Derry, PA-C  fluticasone (FLONASE) 50 MCG/ACT nasal spray Place 2 sprays into both nostrils daily. 05/15/16   Menshew, Charlesetta Ivory, PA-C  HYDROcodone-acetaminophen (NORCO/VICODIN) 5-325 MG per tablet Take 1-2 tablets by mouth every 4 (four) hours as needed. 03/04/13   Junious Silk, PA-C  meloxicam (MOBIC) 15 MG tablet Take 1 tablet (15 mg total) by mouth daily. 11/16/16 11/26/16  Enid Derry, PA-C  metroNIDAZOLE (FLAGYL) 500 MG tablet Take 1 tablet (500 mg total) by mouth 2 (two) times daily. 11/16/16 11/23/16  Enid Derry, PA-C  ondansetron (ZOFRAN ODT) 4 MG disintegrating tablet Take 1 tablet (4 mg total)  by mouth every 6 (six) hours as needed for nausea or vomiting. 05/15/16   Menshew, Charlesetta Ivory, PA-C  penicillin v potassium (VEETID) 500 MG tablet Take 1 tablet (500 mg total) by mouth 4 (four) times daily. 03/04/13   Junious Silk, PA-C  predniSONE (DELTASONE) 10 MG tablet Take 6 tablets on day 1, take 5 tablets on day 2, take 4 tablets on day 3, take 3 tablets on day 4, take 2 tablets on day 5, take 1 tablet on day  6 11/16/16   Enid Derry, PA-C  promethazine (PHENERGAN) 25 MG tablet Take 1 tablet (25 mg total) by mouth every 6 (six) hours as needed for nausea or vomiting. 03/04/13   Junious Silk, PA-C    Allergies Patient has no known allergies.  History reviewed. No pertinent family history.  Social History Social History  Substance Use Topics  . Smoking status: Current Every Day Smoker    Packs/day: 0.25    Types: Cigarettes  . Smokeless tobacco: Never Used  . Alcohol use Yes     Comment: socially     Review of Systems  Constitutional: Positive for chills. ENT: No upper respiratory complaints. Cardiovascular: No chest pain. Respiratory: Positive for cough. Gastrointestinal: No abdominal pain.  Positive for nausea and vomiting. Genitourinary: Positive for dysuria. Skin: Negative for rash, abrasions, lacerations, ecchymosis. Neurological: Negative for numbness or tingling   ____________________________________________   PHYSICAL EXAM:  VITAL SIGNS: ED Triage Vitals  Enc Vitals Group     BP 11/16/16 1051 122/70     Pulse Rate 11/16/16 1051 78     Resp 11/16/16 1051 16     Temp 11/16/16 1051 98.8 F (37.1 C)     Temp Source 11/16/16 1051 Oral     SpO2 11/16/16 1051 100 %     Weight 11/16/16 1052 200 lb (90.7 kg)     Height 11/16/16 1052  (1.6 m)     Head Circumference --      Peak Flow --      Pain Score 11/16/16 1051 5     Pain Loc --      Pain Edu? --      Excl. in GC? --      Constitutional: Alert and oriented. Well appearing and in no acute distress. Eyes: Conjunctivae are normal. PERRL. EOMI. Head: Atraumatic. ENT:      Ears:      Nose: No congestion/rhinnorhea.      Mouth/Throat: Mucous membranes are moist.  Neck: No stridor.  Cardiovascular: Normal rate, regular rhythm.  Good peripheral circulation. Respiratory: Normal respiratory effort without tachypnea or retractions. Lungs CTAB. Good air entry to the bases with no decreased or absent breath  sounds. Gastrointestinal: Bowel sounds 4 quadrants. Soft and nontender to palpation. No guarding or rigidity. No palpable masses. No distention. No CVA tenderness. Musculoskeletal: Full range of motion to all extremities. No gross deformities appreciated. Genitourinary: Health tech present for pelvic exam. No external lesions or rashes. No discharge in vaginal canal. Cervix nontender. Neurologic:  Normal speech and language. No gross focal neurologic deficits are appreciated.  Skin:  Skin is warm, dry and intact. No rash noted.   ____________________________________________   LABS (all labs ordered are listed, but only abnormal results are displayed)  Labs Reviewed  WET PREP, GENITAL - Abnormal; Notable for the following:       Result Value   WBC, Wet Prep HPF POC MANY (*)    All other components within normal limits  CHLAMYDIA/NGC RT PCR ([facility] ONLY)   ____________________________________________  EKG   ____________________________________________  RADIOLOGY Lexine Baton, personally viewed and evaluated these images (plain radiographs) as part of my medical decision making, as well as reviewing the written report by the radiologist.  DG CHEST   IMPRESSION: Negative.  No active cardiopulmonary disease. ____________________________________________    PROCEDURES  Procedure(s) performed:    Procedures    Medications  ketorolac (TORADOL) 30 MG/ML injection 30 mg (30 mg Intramuscular Given 11/16/16 1300)  ondansetron (ZOFRAN-ODT) disintegrating tablet 4 mg (4 mg Oral Given 11/16/16 1620)     ____________________________________________   INITIAL IMPRESSION / ASSESSMENT AND PLAN / ED COURSE  Pertinent labs & imaging results that were available during my care of the patient were reviewed by me and considered in my medical decision making (see chart for details).  Review of the [place] CSRS was performed in accordance of the [facility] prior to dispensing any  controlled drugs.   Patient presented to the emergency department for evaluation of multiple medical concerns. Vital signs and exam are reassuring. Chest x-ray negative for acute cardiopulmonary processes. Lungs are CTAB. CBC, CMP lipase all within normal limits. Abdomen is soft and nontender to palpation. Wet prep was remarkable for an increased WBC count. Gonorrhea, Chlamydia, trichamonas, BV, yeast are negative.  Patient states that she is not able to eat or drink without vomiting. She is drinking soda and water and has not vomited in the 5 hours that she was in the emergency department. Patient states that she is unable to urinate and refuses an in and out catheter. She states that she has had a bad experience with them in the past. She no longer wants to be evaluated for UTI and wants to sign out AMA. She denies flank pain or hematuria. Headaches are not changing in character or happening more frequently. We discussed this could be due to patient not feeling well recently and will treat her other concerns and she will follow up for imaging if headaches do not improve with treatment. Patient appears well and does not appear to be in any pain or distress. She is on house arrest and needs to leave before her bracelet dies. Patient will be discharged home with prescriptions for doxycycline, prednisone, albuterol, meloxicam. Patient is to follow up with PCP as directed. Patient is given ED precautions to return to the ED for any worsening or new symptoms.     ____________________________________________  FINAL CLINICAL IMPRESSION(S) / ED DIAGNOSES  Final diagnoses:  Bronchitis  Acute vaginitis  Acute intractable headache, unspecified headache type  Difficulty urinating  GI problem      NEW MEDICATIONS STARTED DURING THIS VISIT:  Discharge Medication List as of 11/16/2016  4:04 PM    START taking these medications   Details  albuterol (PROVENTIL HFA;VENTOLIN HFA) 108 (90 Base) MCG/ACT  inhaler Inhale 2 puffs into the lungs every 6 (six) hours as needed for wheezing or shortness of breath., Starting Sat 11/16/2016, Print    doxycycline (VIBRAMYCIN) 50 MG capsule Take 2 capsules (100 mg total) by mouth 2 (two) times daily., Starting Sat 11/16/2016, Until Tue 11/26/2016, Print    meloxicam (MOBIC) 15 MG tablet Take 1 tablet (15 mg total) by mouth daily., Starting Sat 11/16/2016, Until Tue 11/26/2016, Print    predniSONE (DELTASONE) 10 MG tablet Take 6 tablets on day 1, take 5 tablets on day 2, take 4 tablets on day 3, take 3 tablets on day 4, take 2 tablets on day  5, take 1 tablet on day 6, Print            This chart was dictated using voice recognition software/Dragon. Despite best efforts to proofread, errors can occur which can change the meaning. Any change was purely unintentional.    Enid Derry, PA-C 11/17/16 1348    Governor Rooks, MD 11/17/16 [phone]

## 2017-03-25 ENCOUNTER — Ambulatory Visit
Admission: EM | Admit: 2017-03-25 | Discharge: 2017-03-25 | Disposition: A | Discharge status: Home / Self Care | Payer: Self-pay | Attending: Emergency Medicine | Admitting: Emergency Medicine

## 2017-03-25 ENCOUNTER — Other Ambulatory Visit: Payer: Self-pay

## 2017-03-25 DIAGNOSIS — R112 Nausea with vomiting, unspecified: Secondary | ICD-10-CM

## 2017-03-25 DIAGNOSIS — M791 Myalgia, unspecified site: Secondary | ICD-10-CM

## 2017-03-25 DIAGNOSIS — J111 Influenza due to unidentified influenza virus with other respiratory manifestations: Secondary | ICD-10-CM

## 2017-03-25 DIAGNOSIS — R51 Headache: Secondary | ICD-10-CM

## 2017-03-25 DIAGNOSIS — R69 Illness, unspecified: Secondary | ICD-10-CM

## 2017-03-25 DIAGNOSIS — J0140 Acute pansinusitis, unspecified: Secondary | ICD-10-CM

## 2017-03-25 DIAGNOSIS — R350 Frequency of micturition: Secondary | ICD-10-CM

## 2017-03-25 DIAGNOSIS — R509 Fever, unspecified: Secondary | ICD-10-CM

## 2017-03-25 LAB — URINALYSIS, COMPLETE (UACMP) WITH MICROSCOPIC
BACTERIA UA: NONE SEEN
Glucose, UA: NEGATIVE mg/dL
Leukocytes, UA: NEGATIVE
Nitrite: NEGATIVE
PROTEIN: NEGATIVE mg/dL
Specific Gravity, Urine: 1.025 (ref 1.005–1.030)
pH: 6.5 (ref 5.0–8.0)

## 2017-03-25 LAB — RAPID INFLUENZA A&B ANTIGENS (ARMC ONLY)
INFLUENZA A (ARMC): NEGATIVE
INFLUENZA B (ARMC): NEGATIVE

## 2017-03-25 MED ORDER — ONDANSETRON 8 MG PO TBDP
8.0000 mg | ORAL_TABLET | Freq: Once | ORAL | Status: AC
  Administered 2017-03-25: 8 mg via ORAL

## 2017-03-25 MED ORDER — AMOXICILLIN-POT CLAVULANATE 875-125 MG PO TABS: 1.0000 | ORAL_TABLET | Freq: Two times a day (BID) | ORAL | 0 refills | Status: DC

## 2017-03-25 MED ORDER — FLUTICASONE PROPIONATE 50 MCG/ACT NA SUSP: 2.0000 | Freq: Every day | NASAL | 0 refills | Status: DC

## 2017-03-25 MED ORDER — TRIAMCINOLONE ACETONIDE 0.1 % EX CREA: 1.0000 "application " | TOPICAL_CREAM | Freq: Two times a day (BID) | CUTANEOUS | 0 refills | Status: DC

## 2017-03-25 MED ORDER — IBUPROFEN 600 MG PO TABS: 600.0000 mg | ORAL_TABLET | Freq: Four times a day (QID) | ORAL | 0 refills | Status: DC | PRN

## 2017-03-25 MED ORDER — ONDANSETRON 8 MG PO TBDP: 8.0000 mg | ORAL_TABLET | Freq: Three times a day (TID) | ORAL | 0 refills | Status: DC | PRN

## 2017-03-25 MED ORDER — OSELTAMIVIR PHOSPHATE 75 MG PO CAPS: 75.0000 mg | ORAL_CAPSULE | Freq: Two times a day (BID) | ORAL | 0 refills | Status: DC

## 2017-03-25 NOTE — ED Triage Notes (Addendum)
Pt with several complaints. Has been exposed to FLU. Has bodyaches, cough, nasal congestion, and fever.Also reports she thinks she has a UTI.

## 2017-03-25 NOTE — ED Provider Notes (Signed)
HPI  SUBJECTIVE:  Kendra Lindsey is a 27 y.o. female who presents with multiple complaints.  First, she reports yellow nasal congestion, rhinorrhea, sinus pain and pressure, cough and frontal headache for the past 2 weeks.  She reports occasional wheezing with deep inspiration, denies chest pain, shortness of breath, dyspnea on exertion.  No allergy type symptoms.  She has tried ibuprofen, Tylenol, DayQuil, over-the-counter cold and cough medicines without improvement in her symptoms.  No aggravating factors.  Second, She reports urinary urgency, frequency, odorous urine for the past week.  No dysuria, cloudy urine, hematuria.  No vaginal bleeding, odor, discharge.  No abdominal or back pain.  Third, She reports the acute onset of body aches, fevers, nausea, several episodes of nonbilious, nonbloody vomiting and watery, nonbloody diarrhea starting yesterday.  States that she is unable to tolerate any p.o. at all.  States that she has been exposed to several people who have confirmed cases of influenza.  She has tried ibuprofen, Tylenol, over-the-counter cold cough and flu medicines without improvement in her symptoms.  Last dose of Tylenol was within 2 hours of evaluation.  There are no other aggravating or alleviating factors.  She denies neck stiffness, ear pain.  Some photophobia. Fourth, She also states that her eczema seems to be flaring up on her elbows and states that she has dry, cracked skin on her hands.  She has tried Aveeno, Eucerin and other lotions without improvement in her symptoms.  States that her symptoms usually respond to triamcinolone which she does not have.  She has a past medical history of eczema, bilateral otitis media, anxiety.  No history of asthma, eczema, COPD, sinusitis, diabetes, hypertension, UTI, pyelonephritis, nephrolithiasis.  She is a smoker.  LMP: Yesterday.  She denies the possibility being pregnant.  She is in a same-sex relationship.  PMD: None.    History  reviewed. No pertinent past medical history.  History reviewed. No pertinent surgical history.  History reviewed. No pertinent family history.  Social History   Tobacco Use  . Smoking status: Current Every Day Smoker    Packs/day: 0.25    Types: Cigarettes  . Smokeless tobacco: Never Used  Substance Use Topics  . Alcohol use: Yes    Comment: socially  . Drug use: No    No current facility-administered medications for this encounter.   Current Outpatient Medications:  .  albuterol (PROVENTIL HFA;VENTOLIN HFA) 108 (90 Base) MCG/ACT inhaler, Inhale 2 puffs into the lungs every 6 (six) hours as needed for wheezing or shortness of breath., Disp: 1 Inhaler, Rfl: 0 .  amoxicillin-clavulanate (AUGMENTIN) 875-125 MG tablet, Take 1 tablet by mouth 2 (two) times daily. X 7 days, Disp: 14 tablet, Rfl: 0 .  fluticasone (FLONASE) 50 MCG/ACT nasal spray, Place 2 sprays into both nostrils daily., Disp: 16 g, Rfl: 0 .  ibuprofen (ADVIL,MOTRIN) 600 MG tablet, Take 1 tablet (600 mg total) by mouth every 6 (six) hours as needed., Disp: 30 tablet, Rfl: 0 .  ondansetron (ZOFRAN ODT) 8 MG disintegrating tablet, Take 1 tablet (8 mg total) by mouth every 8 (eight) hours as needed for nausea or vomiting., Disp: 20 tablet, Rfl: 0 .  oseltamivir (TAMIFLU) 75 MG capsule, Take 1 capsule (75 mg total) by mouth 2 (two) times daily. X 5 days, Disp: 10 capsule, Rfl: 0 .  triamcinolone cream (KENALOG) 0.1 %, Apply 1 application topically 2 (two) times daily. Apply for 2 weeks. May use on face, Disp: 30 g, Rfl: 0  No  Known Allergies   ROS  As noted in HPI.   Physical Exam  BP 129/87 (BP Location: Left Arm)   Pulse 86   Temp 98.5 F (36.9 C) (Oral)   Resp 20   Ht 5' 3.5" (1.613 m)   Wt 185 lb (83.9 kg)   LMP 03/20/2017   SpO2 100%   BMI 32.26 kg/m   Constitutional: Well developed, well nourished, no acute distress Eyes:  EOMI, conjunctiva normal bilaterally HENT: Normocephalic, atraumatic,mucus  membranes moist.  TMs normal bilaterally.  Positive maxillary and frontal sinus tenderness.  Positive purulent nasal congestion.  Normal oropharynx, no exudates, tonsils normal.  Positive postnasal drip.  No cobblestoning. Neck: No cervical lymphadenopathy, meningismus. Respiratory: Normal inspiratory effort, lungs clear bilaterally, good air movement Cardiovascular: Normal rate regular rhythm, no murmurs, rubs, gallops GI: nondistended soft, nontender.  No guarding, rebound.  Positive active bowel sounds.  Negative Murphy, negative McBurney Back: No CVA tenderness skin: Positive dry, cracked skin on hands and scaly places on elbows consistent with eczema, Musculoskeletal: no deformities Neurologic: Alert & oriented x 3, no focal neuro deficits Psychiatric: Speech and behavior appropriate   ED Course   Medications  ondansetron (ZOFRAN-ODT) disintegrating tablet 8 mg (8 mg Oral Given 03/25/17 1910)    Orders Placed This Encounter  Procedures  . Rapid Influenza A&B Antigens ([facility] only)    Standing Status:   Standing    Number of Occurrences:   1  . Urinalysis, Complete w Microscopic    Standing Status:   Standing    Number of Occurrences:   1  . Droplet precaution    Standing Status:   Standing    Number of Occurrences:   1    Results for orders placed or performed during the hospital encounter of 03/25/17 (from the past 24 hour(s))  Urinalysis, Complete w Microscopic     Status: Abnormal   Collection Time: 03/25/17  6:49 PM  Result Value Ref Range   Color, Urine YELLOW YELLOW   APPearance HAZY (A) CLEAR   Specific Gravity, Urine 1.025 1.005 - 1.030   pH 6.5 5.0 - 8.0   Glucose, UA NEGATIVE NEGATIVE mg/dL   Hgb urine dipstick TRACE (A) NEGATIVE   Bilirubin Urine SMALL (A) NEGATIVE   Ketones, ur TRACE (A) NEGATIVE mg/dL   Protein, ur NEGATIVE NEGATIVE mg/dL   Nitrite NEGATIVE NEGATIVE   Leukocytes, UA NEGATIVE NEGATIVE   Squamous Epithelial / LPF 6-30 (A) NONE SEEN   WBC,  UA 0-5 0 - 5 WBC/hpf   RBC / HPF 0-5 0 - 5 RBC/hpf   Bacteria, UA NONE SEEN NONE SEEN   Mucus PRESENT    Urine-Other LESS THAN 10 mL OF URINE SUBMITTED   Rapid Influenza A&B Antigens ([facility] only)     Status: None   Collection Time: 03/25/17  6:49 PM  Result Value Ref Range   Influenza A ([facility]) NEGATIVE NEGATIVE   Influenza B ([facility]) NEGATIVE NEGATIVE   No results found.  ED Clinical Impression  Acute non-recurrent pansinusitis  Influenza-like illness   ED Assessment/Plan  Patient was given Zofran 8 mg.  She was tolerating p.o. prior to discharge.  She does not appear to be significantly dehydrated.  We will try oral rehydration.    1.  URI symptoms, nasal congestion, sinus pain or pressure for 2 weeks.  Feel that this is transition to a sinus of the purulent nasal drainage and the maxillary and frontal sinus tenderness.  Her fevers, body aches may  be coming from this.  We will send home with Flonase, Mucinex D, Augmentin, ibuprofen 600 mg take with 1 g of Tylenol 3-4 times a day as needed.  Advised her to stop all other cold medications.  2.  Flulike symptoms.  May be from a sinusitis.  She does not have a UTI on UA and has no suprapubic, flank, CVA tenderness.  Abdomen is completely benign.  Doubt UTI or intra-abdominal process causing the symptoms.  No evidence of meningitis, otitis, pharyngitis, doubt pneumonia.  Rapid flu is negative, but we will try treating his as if this is influenza with Tamiflu.  Also home with Zofran for the nausea, vomiting and the diarrhea.  3.  Rash on hands.  Appears to be dry, cracked skin.  Could be from excessive handwashing but also may be a contact dermatitis.  Home with triamcinolone ointment.  There is not appear to be any signs of infection.  Advised to use Cerave or Eucerin lotion, or other heavy non-perfumed moisturizing cream.  Writing 3-day work note as well.  Discussed labs, MDM, plan and followup with patient. Discussed sn/sx that should  prompt return to the ED. patient agrees with plan.   Meds ordered this encounter  Medications  . ondansetron (ZOFRAN-ODT) disintegrating tablet 8 mg  . oseltamivir (TAMIFLU) 75 MG capsule    Sig: Take 1 capsule (75 mg total) by mouth 2 (two) times daily. X 5 days    Dispense:  10 capsule    Refill:  0  . ibuprofen (ADVIL,MOTRIN) 600 MG tablet    Sig: Take 1 tablet (600 mg total) by mouth every 6 (six) hours as needed.    Dispense:  30 tablet    Refill:  0  . ondansetron (ZOFRAN ODT) 8 MG disintegrating tablet    Sig: Take 1 tablet (8 mg total) by mouth every 8 (eight) hours as needed for nausea or vomiting.    Dispense:  20 tablet    Refill:  0  . fluticasone (FLONASE) 50 MCG/ACT nasal spray    Sig: Place 2 sprays into both nostrils daily.    Dispense:  16 g    Refill:  0  . amoxicillin-clavulanate (AUGMENTIN) 875-125 MG tablet    Sig: Take 1 tablet by mouth 2 (two) times daily. X 7 days    Dispense:  14 tablet    Refill:  0  . triamcinolone cream (KENALOG) 0.1 %    Sig: Apply 1 application topically 2 (two) times daily. Apply for 2 weeks. May use on face    Dispense:  30 g    Refill:  0    *This clinic note was created using Scientist, clinical (histocompatibility and immunogenetics). Therefore, there may be occasional mistakes despite careful proofreading.   ?   Domenick Gong, MD 03/25/17 2010

## 2017-03-25 NOTE — Discharge Instructions (Signed)
Flonase, Mucinex D, Augmentin, ibuprofen 600 mg to take with 1 g of Tylenol 3-4 times a day. stop all other cold medications.  Try the Tamiflu for your flulike symptoms although I suspect that some of your symptoms are coming from your sinus infection.  Use the triamcinolone ointment on your hands and on your eczema.  You can also try O'Keeffe's working hands, Cerave or continue the Eucerin for your hands.   Here is a list of primary care providers who are taking new patients:  Dr. Elizabeth Sauereanna Jones, Dr. Schuyler AmorWilliam Plonk [address] [place] [address] [phone]  [facility] [place] [address]  [place] [address]  [phone]  [facility] [address]  [place], [place] [address] [phone]  [facility] [place] [address]  [phone] [place], [place] [address]  Here are clinics/ other resources who will see you if you do not have insurance. Some have certain criteria that you must meet. Call them and find out what they are:  [facility]: [address] [place] [place], [place] [address] Phone: [phone] Hours: First and Third Saturdays of each Month, 9 a.m. - 1 p.m.  [facility]: [address], [place], [place] [address] Phone: [phone] Hours: Tuesday, 4 p.m. - 8 p.m. Thursday, 1 p.m. - 8 p.m. Wednesday, 9 a.m. - [facility]  [facility] [address], [place], [place] [address] Phone: [phone] Pharmacy Phone Number: [phone] Dental Phone Number: [phone] [facility] Insurance Help: [phone]  Dental Hours: Monday - Thursday, 8 a.m. - 6 p.m.  [facility] [address] [place], [place] [address] Phone: [phone] Pharmacy Phone Number: [phone] [facility] Help: [phone]  [facility] [address] [place], [place] [address] Phone: [phone] Pharmacy Phone Number: [phone] [facility] Insurance Help:  [phone]  [facility] [address] [place], [place] [address] Phone: [phone] [facility] Insurance Help: [phone]   [facility] [address] [place], [place] [address] Phone: [phone]  Go to www.goodrx.com to look up your medications. This will give you a list of where you can find your prescriptions at the most affordable prices. Or ask the pharmacist what the cash price is, or if they have any other discount programs available to help make your medication more affordable. This can be less expensive than what you would pay with insurance.

## 2017-05-29 ENCOUNTER — Encounter: Payer: Self-pay | Admitting: Emergency Medicine

## 2017-05-29 ENCOUNTER — Other Ambulatory Visit: Payer: Self-pay

## 2017-05-29 ENCOUNTER — Ambulatory Visit
Admission: EM | Admit: 2017-05-29 | Discharge: 2017-05-29 | Disposition: A | Discharge status: Home / Self Care | Payer: Self-pay | Attending: Family Medicine | Admitting: Family Medicine

## 2017-05-29 DIAGNOSIS — K529 Noninfective gastroenteritis and colitis, unspecified: Secondary | ICD-10-CM

## 2017-05-29 DIAGNOSIS — A084 Viral intestinal infection, unspecified: Secondary | ICD-10-CM

## 2017-05-29 LAB — COMPREHENSIVE METABOLIC PANEL
ALBUMIN: 4.1 g/dL (ref 3.5–5.0)
ALT: 17 U/L (ref 14–54)
AST: 20 U/L (ref 15–41)
Alkaline Phosphatase: 49 U/L (ref 38–126)
Anion gap: 9 (ref 5–15)
BILIRUBIN TOTAL: 0.6 mg/dL (ref 0.3–1.2)
BUN: 15 mg/dL (ref 6–20)
CALCIUM: 9 mg/dL (ref 8.9–10.3)
CO2: 24 mmol/L (ref 22–32)
CREATININE: 0.63 mg/dL (ref 0.44–1.00)
Chloride: 104 mmol/L (ref 101–111)
GFR calc Af Amer: >60 mL/min (ref >60)
GFR calc non Af Amer: >60 mL/min (ref >60)
GLUCOSE: 95 mg/dL (ref 65–99)
Potassium: 3.7 mmol/L (ref 3.5–5.1)
Sodium: 137 mmol/L (ref 135–145)
TOTAL PROTEIN: 7.5 g/dL (ref 6.5–8.1)

## 2017-05-29 MED ORDER — PROMETHAZINE HCL 25 MG/ML IJ SOLN
25.0000 mg | Freq: Once | INTRAMUSCULAR | Status: AC
  Administered 2017-05-29: 25 mg via INTRAVENOUS

## 2017-05-29 MED ORDER — ONDANSETRON 8 MG PO TBDP
8.0000 mg | ORAL_TABLET | Freq: Once | ORAL | Status: AC
  Administered 2017-05-29: 8 mg via ORAL

## 2017-05-29 MED ORDER — SODIUM CHLORIDE 0.9 % IV BOLUS
1000.0000 mL | Freq: Once | INTRAVENOUS | Status: AC
  Administered 2017-05-29: 1000 mL via INTRAVENOUS

## 2017-05-29 MED ORDER — ALBUTEROL SULFATE HFA 108 (90 BASE) MCG/ACT IN AERS: 2.0000 | INHALATION_SPRAY | Freq: Four times a day (QID) | RESPIRATORY_TRACT | 0 refills | Status: DC | PRN

## 2017-05-29 MED ORDER — ONDANSETRON 4 MG PO TBDP: 4.0000 mg | ORAL_TABLET | Freq: Three times a day (TID) | ORAL | 0 refills | Status: DC | PRN

## 2017-05-29 MED ORDER — PROMETHAZINE HCL 25 MG PO TABS: 25.0000 mg | ORAL_TABLET | Freq: Three times a day (TID) | ORAL | 0 refills | Status: DC | PRN

## 2017-05-29 NOTE — Discharge Instructions (Signed)
Rest, fluids. ° °Medication as needed. ° °Take care ° °Dr. Ova Gillentine  °

## 2017-05-29 NOTE — ED Provider Notes (Signed)
[facility]    CSN: [id number] Arrival date & time: 05/29/17  1502  History   Chief Complaint Chief Complaint  Patient presents with  . Emesis   HPI  27 year old female presents with nausea, vomiting, diarrhea.  Started abruptly this morning.  She states that her significant other has had the same symptoms.  She has been caring for her recently.  She states that she was diagnosed with gastroenteritis.  Patient presents today reporting severe nausea, vomiting, diarrhea as of this morning.  She states that she has vomited over 20 times.  She reports that her symptoms are intractable.  She has not been able to eat or drink.  Patient states that she feels very poorly.  No known exacerbating factors.  She reports associated abdominal discomfort.  No relieving factors.  No other complaints concerns at this time.  Social History Social History   Tobacco Use  . Smoking status: Current Every Day Smoker    Packs/day: 0.25    Types: Cigarettes  . Smokeless tobacco: Never Used  Substance Use Topics  . Alcohol use: Yes    Comment: socially  . Drug use: No     Allergies   Patient has no known allergies.   Review of Systems Review of Systems  Constitutional: Positive for appetite change. Negative for fever.  Gastrointestinal: Positive for abdominal pain, diarrhea, nausea and vomiting.   Physical Exam Triage Vital Signs ED Triage Vitals  Enc Vitals Group     BP 05/29/17 1519 126/89     Pulse Rate 05/29/17 1519 88     Resp 05/29/17 1519 16     Temp 05/29/17 1519 97.8 F (36.6 C)     Temp Source 05/29/17 1519 Oral     SpO2 05/29/17 1519 100 %     Weight 05/29/17 1520 185 lb (83.9 kg)     Height 05/29/17 1520 5\' 3"  (1.6 m)     Head Circumference --      Peak Flow --      Pain Score 05/29/17 1519 0     Pain Loc --      Pain Edu? --      Excl. in GC? --    Updated Vital Signs BP 126/89 (BP Location: Left Arm)   Pulse 88   Temp 97.8 F (36.6 C) (Oral)   Resp 16    Ht 5\' 3"  (1.6 m)   Wt 185 lb (83.9 kg)   LMP 05/12/2017 (Approximate)   SpO2 100%   BMI 32.77 kg/m   Physical Exam  Constitutional: She appears well-developed. No distress.  Appears fatigued.  HENT:  Oropharynx clear. Dry.  Eyes: Conjunctivae are normal.  Cardiovascular: Normal rate and regular rhythm.  Pulmonary/Chest: Effort normal and breath sounds normal. She has no wheezes. She has no rales.  Abdominal:  Soft, nondistended.  Tender to palpation in the epigastric region.  Neurological: She is alert.  Psychiatric: She has a normal mood and affect. Her behavior is normal.  Nursing note and vitals reviewed.  UC Treatments / Results  Labs (all labs ordered are listed, but only abnormal results are displayed) Labs Reviewed  COMPREHENSIVE METABOLIC PANEL    EKG None Radiology No results found.  Procedures Procedures (including critical care time)  Medications Ordered in UC Medications  ondansetron (ZOFRAN-ODT) disintegrating tablet 8 mg (8 mg Oral Given 05/29/17 1526)  sodium chloride 0.9 % bolus 1,000 mL (0 mLs Intravenous Stopped 05/29/17 1630)  promethazine (PHENERGAN) injection 25 mg (25 mg  Intravenous Given 05/29/17 1559)     Initial Impression / Assessment and Plan / UC Course  I have reviewed the triage vital signs and the nursing notes.  Pertinent labs & imaging results that were available during my care of the patient were reviewed by me and considered in my medical decision making (see chart for details).    27 year old female presents with viral gastroenteritis.  Patient given IV fluids today.  Also treated with Zofran and Phenergan with improvement.  Sending home on Zofran and Phenergan.  Advised hydration.  Work note given.  Final Clinical Impressions(s) / UC Diagnoses   Final diagnoses:  Gastroenteritis    ED Discharge Orders        Ordered    ondansetron (ZOFRAN-ODT) 4 MG disintegrating tablet  Every 8 hours PRN     05/29/17 1614    promethazine  (PHENERGAN) 25 MG tablet  Every 8 hours PRN     05/29/17 1614    albuterol (PROVENTIL HFA;VENTOLIN HFA) 108 (90 Base) MCG/ACT inhaler  Every 6 hours PRN     05/29/17 1634     Controlled Substance Prescriptions [place] Controlled Substance Registry consulted? Not Applicable   Tommie Sams, DO 05/29/17 1636

## 2017-05-29 NOTE — ED Triage Notes (Signed)
Patient in today c/o emesis, diarrhea since this morning. Patient states that her partner went to urgent care this morning and was diagnosed with Norovirus.

## 2017-08-12 ENCOUNTER — Other Ambulatory Visit: Payer: Self-pay

## 2017-08-12 ENCOUNTER — Ambulatory Visit
Admission: EM | Admit: 2017-08-12 | Discharge: 2017-08-12 | Disposition: A | Discharge status: Home / Self Care | Payer: Self-pay | Attending: Emergency Medicine | Admitting: Emergency Medicine

## 2017-08-12 DIAGNOSIS — J4521 Mild intermittent asthma with (acute) exacerbation: Secondary | ICD-10-CM

## 2017-08-12 DIAGNOSIS — K0889 Other specified disorders of teeth and supporting structures: Secondary | ICD-10-CM

## 2017-08-12 DIAGNOSIS — J45901 Unspecified asthma with (acute) exacerbation: Secondary | ICD-10-CM

## 2017-08-12 DIAGNOSIS — K047 Periapical abscess without sinus: Secondary | ICD-10-CM

## 2017-08-12 DIAGNOSIS — J069 Acute upper respiratory infection, unspecified: Secondary | ICD-10-CM

## 2017-08-12 MED ORDER — AEROCHAMBER PLUS MISC: 2 refills | Status: DC

## 2017-08-12 MED ORDER — IPRATROPIUM-ALBUTEROL 0.5-2.5 (3) MG/3ML IN SOLN
3.0000 mL | Freq: Once | RESPIRATORY_TRACT | Status: AC
  Administered 2017-08-12: 3 mL via RESPIRATORY_TRACT

## 2017-08-12 MED ORDER — KETOROLAC TROMETHAMINE 60 MG/2ML IM SOLN
30.0000 mg | Freq: Once | INTRAMUSCULAR | Status: AC
  Administered 2017-08-12: 30 mg via INTRAMUSCULAR

## 2017-08-12 MED ORDER — HYDROCODONE-ACETAMINOPHEN 5-325 MG PO TABS: 1.0000 | ORAL_TABLET | Freq: Four times a day (QID) | ORAL | 0 refills | Status: DC | PRN

## 2017-08-12 MED ORDER — ACETAMINOPHEN 500 MG PO TABS
1000.0000 mg | ORAL_TABLET | Freq: Once | ORAL | Status: AC
Start: 2017-08-12 — End: 2017-08-12
  Administered 2017-08-12: 1000 mg via ORAL

## 2017-08-12 MED ORDER — DEXAMETHASONE SODIUM PHOSPHATE 10 MG/ML IJ SOLN
10.0000 mg | Freq: Once | INTRAMUSCULAR | Status: AC
  Administered 2017-08-12: 10 mg via INTRAMUSCULAR

## 2017-08-12 MED ORDER — ALBUTEROL SULFATE HFA 108 (90 BASE) MCG/ACT IN AERS: 1.0000 | INHALATION_SPRAY | Freq: Four times a day (QID) | RESPIRATORY_TRACT | 0 refills | Status: DC | PRN

## 2017-08-12 MED ORDER — IBUPROFEN 600 MG PO TABS: 600.0000 mg | ORAL_TABLET | Freq: Four times a day (QID) | ORAL | 0 refills | Status: DC | PRN

## 2017-08-12 MED ORDER — PREDNISONE 10 MG (21) PO TBPK: ORAL_TABLET | ORAL | 0 refills | Status: DC

## 2017-08-12 MED ORDER — PREDNISONE 10 MG PO TABS: 60.0000 mg | ORAL_TABLET | Freq: Once | ORAL | Status: DC

## 2017-08-12 MED ORDER — AMOXICILLIN-POT CLAVULANATE 875-125 MG PO TABS: 1.0000 | ORAL_TABLET | Freq: Two times a day (BID) | ORAL | 0 refills | Status: DC

## 2017-08-12 MED ORDER — FLUTICASONE PROPIONATE 50 MCG/ACT NA SUSP: 2.0000 | Freq: Every day | NASAL | 0 refills | Status: DC

## 2017-08-12 NOTE — Discharge Instructions (Addendum)
Take two puffs from your albuterol inhaler every 4 hours. Finish the steroids unless your doctor tells you to stop. Finish the antibiotics, even if you feel better. Do a peak flow, once the morning and once at night. Write this down. The number should be going up, not down. You may decrease the frequency of your albuterol inhaler as the numbers go up and you start feeling better. You may start the steroids tomorrow, as you have already taken today's dose. You may take tylenol 1 gram up to 4 times a day as needed for pain. This with 600 mg of motrin is an effective combination for pain and fever. Make sure you drink extra fluids. Return if you get worse, have a fever >100.4, or any other concerns.     OPTIONS FOR DENTAL FOLLOW UP CARE  [facility] of Health and Human Services - Local Safety Net Dental Clinics [email or web address]   [facility] [phone])  Sharl Ma [phone])  [place] [phone])  [facility] [phone])  [facility] [phone]) This clinic caters to the indigent population and is on a lottery system. Location: [employer] of Dentistry, [employer], [address], [place] Clinic Hours: Wednesdays from 6pm - 9pm, patients seen by a lottery system. For dates, call or go to [email or web address] Services: Cleanings, fillings and simple extractions. Payment Options: DENTAL WORK IS FREE OF CHARGE. Bring proof of income or support. Best way to get seen: Arrive at 5:15 pm - this is a lottery, NOT first come/first serve, so arriving earlier will not increase your chances of being seen.     [facility] [phone] Select option 1 for emergencies   Location: [facility]istry, [place], [address] [place] Clinic Hours: No walk-ins accepted - call the day before  to schedule an appointment. Check in times are 9:30 am and 1:30 pm. Services: Simple extractions, temporary fillings, pulpectomy/pulp debridement, uncomplicated abscess drainage. Payment Options: PAYMENT IS DUE AT THE TIME OF SERVICE.  Fee is usually $100-200, additional surgical procedures (e.g. abscess drainage) may be extra. Cash, checks, Visa/MasterCard accepted.  Can file Medicaid if patient is covered for dental - patient should call case worker to check. No discount for [facility] patients. Best way to get seen: MUST call the day before and get onto the schedule. Can usually be seen the next 1-2 days. No walk-ins accepted.     [facility] [phone]   Location: [facility], [address], [place] Clinic Hours: M, W, Th, F 8am or 1:30pm, Tues 9a or 1:30 - first come/first served. Services: Simple extractions, temporary fillings, uncomplicated abscess drainage.  You do not need to be an [facility] resident. Payment Options: PAYMENT IS DUE AT THE TIME OF SERVICE. Dental insurance, otherwise sliding scale - bring proof of income or support. Depending on income and treatment needed, cost is usually $50-200. Best way to get seen: Arrive early as it is first come/first served.     [facility] [phone]   Location: [address] Clinic Hours: Mon-Thu 8a-5p Services: Most basic dental services including extractions and fillings. Payment Options: PAYMENT IS DUE AT THE TIME OF SERVICE. Sliding scale, up to 50% off - bring proof if income or support. Medicaid with dental option accepted. Best way to get seen: Call to schedule an appointment, can usually be seen within 2 weeks OR they will  try to see walk-ins - show up at 8a or 2p (you may have to wait).     [facility] [phone] [place] RESIDENTS ONLY   Location: [facility], [address], [place], [place] [address] Clinic Hours: By appointment only. Monday - Thursday 8am-5pm, Friday 8am-12pm Services: Cleanings, fillings, extractions. Payment Options: PAYMENT IS DUE AT THE TIME OF SERVICE. Cash, Visa or MasterCard. Sliding scale - $30 minimum per service. Best way to get seen: Come in to office, complete packet and make an appointment - need proof of income or support monies for each household member and proof of [facility] [place] [place] residence. Usually takes about a month to get in.     [facility] [phone]   Location: [address] [facility] Hours: Walk-in Urgent Care Dental Services are offered Monday-Friday mornings only. The numbers of emergencies accepted daily is limited to the number of providers available. Maximum 15 - Mondays, Wednesdays & Thursdays Maximum 10 - Tuesdays & Fridays Services: You do not need to be a [place] resident to be seen for a dental emergency. Emergencies are defined as pain, swelling, abnormal bleeding, or dental trauma. Walkins will receive x-rays if needed. NOTE: Dental cleaning is not an emergency. Payment Options: PAYMENT IS DUE AT THE TIME OF SERVICE. Minimum co-pay is $40.00 for uninsured patients. Minimum co-pay is $3.00 for Medicaid with dental coverage. Dental Insurance is accepted and must be presented at time of visit. Medicare does not cover dental. Forms of payment: Cash, credit card, checks. Best way to get seen: If not previously registered with the clinic, walk-in dental registration begins at 7:15 am and is on a first come/first serve basis. If previously registered with the clinic, call to make an appointment.     [facility] [phone] [place] RESIDENTS ONLY   Location: [address], [place], [place] Clinic Hours: Mon-Thu 10a-2p Services: Extractions only! Payment Options: FREE (donations accepted) - bring proof of income or  support Best way to get seen: Call and schedule an appointment OR come at 8am on the 1st Monday of every month (except for holidays) when it is first come/first served.     [facility] [phone]   Location: [address], [place] [facility] Hours: Friday mornings Services, Payment Options, Best way to get seen: Call for info Here is a list of primary care providers who are taking new patients:  Dr. Elizabeth Sauereanna Jones, Dr. Schuyler AmorWilliam Plonk [address] [place] [address] [phone]  [facility] [place] [address]  [place] [address]  [phone]  [facility] [address]  [place], [place] [address] [phone]  [facility] [place] [address]  [phone] [place], [place] [address]  Here are clinics/ other resources who will see you if you do not have insurance. Some have certain criteria that you must meet. Call them and find out what they are:  [facility]: [address] [place], [place] [address] Phone: [phone] Hours: First and Third Saturdays of each Month, 9 a.m. - 1 p.m.  [facility]: [address], [place], [place] [address] Phone: [phone] Hours: Tuesday, 4 p.m. - 8 p.m. Thursday, 1 p.m. - 8 p.m. Wednesday, 9 a.m. - [facility]  [facility] [address], [place], [place] [address] Phone: [phone] Pharmacy Phone Number: [phone] Dental Phone Number: [phone] [facility] Insurance Help: [phone]  Dental Hours: Monday -  Thursday, 8 a.m. - 6 p.m.  [facility] [address], [place], [place] [address] Phone: [phone] Pharmacy Phone Number: [phone] [facility] Insurance Help: [phone]  [facility] [address] [place], [place] [address] Phone: [phone] Pharmacy Phone Number: [phone] [facility] Insurance Help: [phone]  [facility] [address] [place], [place]  [address] Phone: [phone] [facility] Insurance Help: [phone]   [facility] [address], [place], [place] [address] Phone: [phone]  Go to www.goodrx.com to look up your medications. This will give you a list of where you can find your prescriptions at the most affordable prices. Or ask the pharmacist what the cash price is, or if they have any other discount programs available to help make your medication more affordable. This can be less expensive than what you would pay with insurance.

## 2017-08-12 NOTE — ED Provider Notes (Signed)
HPI  SUBJECTIVE:  Kendra Lindsey is a 27 y.o. female who presents with 2 complaints.  First she reports shortness of breath starting last night.  She has had several days of URI symptoms with nasal congestion, rhinorrhea, postnasal drip, sinus pain and pressure, bilateral ear pain.  She reports cough productive of greenish-yellow sputum, wheezing, burning dull chest pain, dyspnea on exertion.  She states that she feels like she is "breathing through a straw".  She tried her girlfriend's albuterol inhaler with improvement in her symptoms.  She also tried hot showers.  Symptoms worse with walking, talking and large positional changes.  She has not tried anything else for her shortness of breath.  No antibiotics in the past month.  She states this feels identical to previous asthma exacerbations.  No calf pain, swelling, OCP use, hemoptysis, immobilization, surgery in the past 4 weeks.  No recent steroids or admissions for her asthma.  Second, she states that she broke a right lower posterior molar 1 month ago.  States that she was eating something this weekend and it tore the gum.  She reports dull, throbbing constant achy pain that becomes sharp with exposure to hot/cold temperatures, exposure to air.  She reports cheek swelling.  She reports a right-sided headache, gingival swelling and states that she had a "pustule" on her gum.  She denies trismus, fevers, sensation of throat swelling shut, neck stiffness.  She has been alternating ibuprofen 800 mg with 1 g of Tylenol without improvement in her symptoms.  Last dose of ibuprofen was 6 hours ago.  Symptoms worse with breathing, eating, drinking.  Past medical history of asthma, otitis media.  She is a smoker.  No History of PE, DVT, CHF, diabetes, hypertension.  LMP: Last week.  Denies possibility of being pregnant.  PMD: None.    History reviewed. No pertinent past medical history.  Past Surgical History:  Procedure Laterality Date  . NO PAST  SURGERIES      Family History  Problem Relation Age of Onset  . Drug abuse Mother   . Scoliosis Mother   . Mental illness Mother     Social History   Tobacco Use  . Smoking status: Current Every Day Smoker    Packs/day: 0.50    Types: Cigarettes  . Smokeless tobacco: Never Used  Substance Use Topics  . Alcohol use: Yes    Comment: socially  . Drug use: No    No current facility-administered medications for this encounter.   Current Outpatient Medications:  .  albuterol (PROVENTIL HFA;VENTOLIN HFA) 108 (90 Base) MCG/ACT inhaler, Inhale 1-2 puffs into the lungs every 6 (six) hours as needed for wheezing or shortness of breath., Disp: 1 Inhaler, Rfl: 0 .  amoxicillin-clavulanate (AUGMENTIN) 875-125 MG tablet, Take 1 tablet by mouth 2 (two) times daily. X 7 days, Disp: 14 tablet, Rfl: 0 .  fluticasone (FLONASE) 50 MCG/ACT nasal spray, Place 2 sprays into both nostrils daily., Disp: 16 g, Rfl: 0 .  HYDROcodone-acetaminophen (NORCO/VICODIN) 5-325 MG tablet, Take 1-2 tablets by mouth every 6 (six) hours as needed for moderate pain or severe pain., Disp: 20 tablet, Rfl: 0 .  ibuprofen (ADVIL,MOTRIN) 600 MG tablet, Take 1 tablet (600 mg total) by mouth every 6 (six) hours as needed., Disp: 30 tablet, Rfl: 0 .  predniSONE (STERAPRED UNI-PAK 21 TAB) 10 MG (21) TBPK tablet, Dispense one 6 day pack. Take as directed with food., Disp: 21 tablet, Rfl: 0 .  Spacer/Aero-Holding Chambers (AEROCHAMBER PLUS) inhaler,  Use as instructed, Disp: 1 each, Rfl: 2  No Known Allergies   ROS  As noted in HPI.   Physical Exam  BP 125/87 (BP Location: Left Arm)   Pulse 85   Temp 98.2 F (36.8 C) (Oral)   Resp 18   Ht 5\' 3"  (1.6 m)   Wt 175 lb (79.4 kg)   LMP 08/05/2017   SpO2 100%   BMI 31.00 kg/m   Constitutional: Well developed, well nourished, Tearful, anxious. Eyes:  EOMI, conjunctiva normal bilaterally HENT: Normocephalic, atraumatic,mucus membranes moist.  Erythematous, swollen  turbinates.  Mucoid nasal congestion.  Positive maxillary and frontal sinus tenderness.  Poor dentition.  Tooth #31: Second right lower molar broken, tender to palpation.  No gingival swelling.  No swelling underneath the tongue.  No obvious facial swelling.  No trismus. Neck: Positive right-sided cervical lymphadenopathy Respiratory: Normal inspiratory effort, lungs clear bilaterally, fair air movement.  diffuse chest wall tenderness. Cardiovascular: Normal rate, regular rhythm, no murmurs, rubs, gallops GI: nondistended skin: No rash, skin intact Musculoskeletal: no deformities calves symmetric, nontender, no edema. Neurologic: Alert & oriented x 3, no focal neuro deficits Psychiatric: Speech and behavior appropriate   ED Course   Medications  ipratropium-albuterol (DUONEB) 0.5-2.5 (3) MG/3ML nebulizer solution 3 mL (3 mLs Nebulization Given 08/12/17 1643)  ketorolac (TORADOL) injection 30 mg (30 mg Intramuscular Given 08/12/17 1642)  acetaminophen (TYLENOL) tablet 1,000 mg (1,000 mg Oral Given 08/12/17 1642)  dexamethasone (DECADRON) injection 10 mg (10 mg Intramuscular Given 08/12/17 1642)    Orders Placed This Encounter  Procedures  . Peak flow    Standing Status:   Standing    Number of Occurrences:   [phone]    Order Specific Question:   Pre and post Neb    Answer:   Yes    No results found for this or any previous visit (from the past 24 hour(s)). No results found.  ED Clinical Impression  Pain, dental  Mild asthma with acute exacerbation, unspecified whether persistent  Dental infection  Acute upper respiratory infection   ED Assessment/Plan   Narcotic database reviewed for this patient, and feel that the risk/benefit ratio today is favorable for proceeding with a prescription for controlled substance.  Last opiate prescription was 05/2016.  1.  Presumed asthma exacerbation secondary to URI.  Gave DuoNeb and dexamethasone 10 mg IM x1.  Calculated peak flow 615.   Pretreatment peak flow 340/280/320. Posttreatment #1: 350/430/400.  Patient states that she feels better.  Lungs still clear.  Improved air movement. Patient is PERC negative.  Doubt PE.  Home with albuterol inhaler with a spacer 2 puffs every 4-6 hours, and is to keep a peak flow log, 6-day prednisone taper.  Start steroids tomorrow.  Will provide a primary care referral list for further care.  Gave patient strict ER return precautions.  2: Dental decay/dental pain.  Patient with a dental infection.  Giving a gram of Tylenol with Toradol 30 mg IM x1, last dose of ibuprofen was 6 hours ago, has not taken any Tylenol today.  Will perform dental block.  Plan to send home with Listerine, Norco, ibuprofen 600 mg to take with 1 g of Tylenol, Augmentin, because she does have sinus tenderness which could be suggestive of a sinusitis.  Ad[facility].  Will provide a local dental resource list.  Procedure note: Attempted a dental block with bupivacaine 0.25% without resolution in symptoms.  Patient declined a repeat injection.  Providing primary care referral list.  Discussed  MDM, treatment plan, and plan for follow-up with patient. Discussed sn/sx that should prompt return to the ED. patient agrees with plan.   Meds ordered this encounter  Medications  . ipratropium-albuterol (DUONEB) 0.5-2.5 (3) MG/3ML nebulizer solution 3 mL  . DISCONTD: predniSONE (DELTASONE) tablet 60 mg  . ketorolac (TORADOL) injection 30 mg  . acetaminophen (TYLENOL) tablet 1,000 mg  . dexamethasone (DECADRON) injection 10 mg  . predniSONE (STERAPRED UNI-PAK 21 TAB) 10 MG (21) TBPK tablet    Sig: Dispense one 6 day pack. Take as directed with food.    Dispense:  21 tablet    Refill:  0  . albuterol (PROVENTIL HFA;VENTOLIN HFA) 108 (90 Base) MCG/ACT inhaler    Sig: Inhale 1-2 puffs into the lungs every 6 (six) hours as needed for wheezing or shortness of breath.    Dispense:  1 Inhaler     Refill:  0  . Spacer/Aero-Holding Chambers (AEROCHAMBER PLUS) inhaler    Sig: Use as instructed    Dispense:  1 each    Refill:  2  . fluticasone (FLONASE) 50 MCG/ACT nasal spray    Sig: Place 2 sprays into both nostrils daily.    Dispense:  16 g    Refill:  0  . HYDROcodone-acetaminophen (NORCO/VICODIN) 5-325 MG tablet    Sig: Take 1-2 tablets by mouth every 6 (six) hours as needed for moderate pain or severe pain.    Dispense:  20 tablet    Refill:  0  . ibuprofen (ADVIL,MOTRIN) 600 MG tablet    Sig: Take 1 tablet (600 mg total) by mouth every 6 (six) hours as needed.    Dispense:  30 tablet    Refill:  0  . amoxicillin-clavulanate (AUGMENTIN) 875-125 MG tablet    Sig: Take 1 tablet by mouth 2 (two) times daily. X 7 days    Dispense:  14 tablet    Refill:  0    *This clinic note was created using Scientist, clinical (histocompatibility and immunogenetics). Therefore, there may be occasional mistakes despite careful proofreading.   ?    Domenick Gong, MD 08/12/17 1811

## 2017-08-12 NOTE — ED Triage Notes (Signed)
Patient complains of cough and congestion x 1 week with shortness of breath. History of Asthma.   Patient states that she also has dental pain in a tooth that is broken.

## 2017-11-06 ENCOUNTER — Encounter: Payer: Self-pay | Admitting: Emergency Medicine

## 2017-11-06 ENCOUNTER — Ambulatory Visit
Admission: EM | Admit: 2017-11-06 | Discharge: 2017-11-06 | Disposition: A | Discharge status: Home / Self Care | Payer: Self-pay | Attending: Family Medicine | Admitting: Family Medicine

## 2017-11-06 ENCOUNTER — Ambulatory Visit (INDEPENDENT_AMBULATORY_CARE_PROVIDER_SITE_OTHER): Payer: Self-pay

## 2017-11-06 ENCOUNTER — Other Ambulatory Visit: Payer: Self-pay

## 2017-11-06 DIAGNOSIS — K047 Periapical abscess without sinus: Secondary | ICD-10-CM

## 2017-11-06 DIAGNOSIS — J45901 Unspecified asthma with (acute) exacerbation: Secondary | ICD-10-CM

## 2017-11-06 DIAGNOSIS — Z760 Encounter for issue of repeat prescription: Secondary | ICD-10-CM

## 2017-11-06 DIAGNOSIS — H5789 Other specified disorders of eye and adnexa: Secondary | ICD-10-CM

## 2017-11-06 MED ORDER — POLYMYXIN B-TRIMETHOPRIM 10000-0.1 UNIT/ML-% OP SOLN: 1.0000 [drp] | Freq: Four times a day (QID) | OPHTHALMIC | 0 refills | Status: AC

## 2017-11-06 MED ORDER — CLINDAMYCIN HCL 300 MG PO CAPS: 300.0000 mg | ORAL_CAPSULE | Freq: Three times a day (TID) | ORAL | 0 refills | Status: AC

## 2017-11-06 MED ORDER — ALBUTEROL SULFATE HFA 108 (90 BASE) MCG/ACT IN AERS: 1.0000 | INHALATION_SPRAY | Freq: Four times a day (QID) | RESPIRATORY_TRACT | 1 refills | Status: DC | PRN

## 2017-11-06 MED ORDER — HYDROCODONE-ACETAMINOPHEN 5-325 MG PO TABS: 1.0000 | ORAL_TABLET | Freq: Three times a day (TID) | ORAL | 0 refills | Status: DC | PRN

## 2017-11-06 NOTE — ED Triage Notes (Signed)
Patient c/o SOB and cough that started for about 2 weeks.  Patient reports fevers in the morning.  Patient reports history of asthma.  Patient also reports dental pain on the right side of her face.  Patient states that she had an abcesed tooth and did not finish her antibiotics for this.  Patient states that she does not have insurance and has no inhalers.
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
## 2017-11-06 NOTE — Discharge Instructions (Addendum)
Medications as prescribed. ° °Take care ° °Dr. Lamont Tant  °

## 2018-02-21 ENCOUNTER — Encounter: Payer: Self-pay | Admitting: Gynecology

## 2018-02-21 ENCOUNTER — Ambulatory Visit
Admission: EM | Admit: 2018-02-21 | Discharge: 2018-02-21 | Disposition: A | Discharge status: Home / Self Care | Payer: Self-pay | Attending: Family Medicine | Admitting: Family Medicine

## 2018-02-21 ENCOUNTER — Other Ambulatory Visit: Payer: Self-pay

## 2018-02-21 DIAGNOSIS — J45901 Unspecified asthma with (acute) exacerbation: Secondary | ICD-10-CM

## 2018-02-21 DIAGNOSIS — F17210 Nicotine dependence, cigarettes, uncomplicated: Secondary | ICD-10-CM

## 2018-02-21 DIAGNOSIS — J988 Other specified respiratory disorders: Secondary | ICD-10-CM

## 2018-02-21 HISTORY — DX: Unspecified asthma, uncomplicated: J45.909

## 2018-02-21 MED ORDER — DOXYCYCLINE HYCLATE 100 MG PO CAPS: 100.0000 mg | ORAL_CAPSULE | Freq: Two times a day (BID) | ORAL | 0 refills | Status: DC

## 2018-02-21 MED ORDER — BENZONATATE 100 MG PO CAPS: 100.0000 mg | ORAL_CAPSULE | Freq: Three times a day (TID) | ORAL | 0 refills | Status: DC | PRN

## 2018-02-21 MED ORDER — PREDNISONE 50 MG PO TABS: ORAL_TABLET | ORAL | 0 refills | Status: DC

## 2018-02-21 MED ORDER — ALBUTEROL SULFATE HFA 108 (90 BASE) MCG/ACT IN AERS: 1.0000 | INHALATION_SPRAY | Freq: Four times a day (QID) | RESPIRATORY_TRACT | 1 refills | Status: AC | PRN

## 2018-02-21 MED ORDER — ALBUTEROL SULFATE (2.5 MG/3ML) 0.083% IN NEBU
2.5000 mg | INHALATION_SOLUTION | Freq: Once | RESPIRATORY_TRACT | Status: AC
  Administered 2018-02-21: 2.5 mg via RESPIRATORY_TRACT

## 2018-02-21 NOTE — Discharge Instructions (Signed)
Meds as prescribed. ° °Take care ° °Dr. Vanna Sailer  °

## 2018-02-21 NOTE — ED Triage Notes (Addendum)
Patient c/o cough x 2 weeks. Per patient asthma acting up. Per patient does not have any insurance and does not pick up her medication that was given to her in 10/2017

## 2018-02-21 NOTE — ED Provider Notes (Signed)
[facility]    CSN: [id number] Arrival date & time: 02/21/18  1223  History   Chief Complaint Chief Complaint  Patient presents with  . Cough   HPI  27 year old female with uncontrolled asthma and noncompliance presents with respiratory symptoms.  Patient reports a 2-week history of respiratory symptoms.  Patient is difficult to follow.  She states that she has had cough, chest tightness, congestion, productive cough, headache.  She states that she generally feels poorly.  No reports of fever although she has felt fevers.  Patient believes that her asthma is flaring.  I have seen her for similar complaints previously.  She has been unable to afford her medications previously.  Additionally, patient was seen in the ER on 9/15.  She endorsed cocaine use to nursing staff.  Continues to smoke.  Patient requesting albuterol treatment.  Patient also requesting medication for regarding her asthma.  No other associated symptoms.  No other complaints.  PMH, Surgical Hx, Family Hx, Social History reviewed and updated as below.  Past Medical History:  Diagnosis Date  . Asthma    Past Surgical History:  Procedure Laterality Date  . NO PAST SURGERIES     OB History   No obstetric history on file.    Home Medications    Prior to Admission medications   Medication Sig Start Date End Date Taking? Authorizing Provider  albuterol (PROVENTIL HFA;VENTOLIN HFA) 108 (90 Base) MCG/ACT inhaler Inhale 1-2 puffs into the lungs every 6 (six) hours as needed for wheezing or shortness of breath. 02/21/18   [name], Meria Crilly G, DO  benzonatate (TESSALON) 100 MG capsule Take 1 capsule (100 mg total) by mouth 3 (three) times daily as needed. 02/21/18   [name], Alonah Lineback G, DO  doxycycline (VIBRAMYCIN) 100 MG capsule Take 1 capsule (100 mg total) by mouth 2 (two) times daily. 02/21/18   [name], Ezri Fanguy G, DO  predniSONE (DELTASONE) 50 MG tablet 1 tablet daily x 5 days 02/21/18   [name], Aloha Bartok G, DO    Family  History Family History  Problem Relation Age of Onset  . Drug abuse Mother   . Scoliosis Mother   . Mental illness Mother     Social History Social History   Tobacco Use  . Smoking status: Current Every Day Smoker    Packs/day: 0.50    Types: Cigarettes  . Smokeless tobacco: Never Used  Substance Use Topics  . Alcohol use: Yes    Comment: socially  . Drug use: Yes    Types: Cocaine    Allergies   Patient has no known allergies.   Review of Systems Review of Systems Per HPI  Physical Exam Triage Vital Signs ED Triage Vitals  Enc Vitals Group     BP 02/21/18 1248 (!) 128/91     Pulse Rate 02/21/18 1248 73     Resp --      Temp 02/21/18 1248 97.6 F (36.4 C)     Temp Source 02/21/18 1248 Oral     SpO2 02/21/18 1248 98 %     Weight 02/21/18 1248 180 lb (81.6 kg)     Height 02/21/18 1248 5\' 2"  (1.575 m)     Head Circumference --      Peak Flow --      Pain Score 02/21/18 1247 5     Pain Loc --      Pain Edu? --      Excl. in GC? --    Updated Vital Signs BP Marland Kitchen(!)  128/91 (BP Location: Left Arm)   Pulse 73   Temp 97.6 F (36.4 C) (Oral)   Ht 5\' 2"  (1.575 m)   Wt 81.6 kg   LMP 02/14/2018   SpO2 98%   BMI 32.92 kg/m   Visual Acuity Right Eye Distance:   Left Eye Distance:   Bilateral Distance:    Right Eye Near:   Left Eye Near:    Bilateral Near:     Physical Exam Vitals signs and nursing note reviewed.  Constitutional:      General: She is not in acute distress. HENT:     Head: Normocephalic and atraumatic.     Right Ear: Tympanic membrane normal.     Left Ear: Tympanic membrane normal.  Eyes:     General:        Right eye: No discharge.        Left eye: No discharge.     Conjunctiva/sclera: Conjunctivae normal.  Cardiovascular:     Rate and Rhythm: Normal rate and regular rhythm.  Pulmonary:     Effort: Pulmonary effort is normal.     Comments: Coarse breath sounds. Neurological:     Mental Status: She is alert.  Psychiatric:         Mood and Affect: Mood normal.        Behavior: Behavior normal.    UC Treatments / Results  Labs (all labs ordered are listed, but only abnormal results are displayed) Labs Reviewed - No data to display  EKG None  Radiology No results found.  Procedures Procedures (including critical care time)  Medications Ordered in UC Medications  albuterol (PROVENTIL) (2.5 MG/3ML) 0.083% nebulizer solution 2.5 mg (2.5 mg Nebulization Given 02/21/18 1322)    Initial Impression / Assessment and Plan / UC Course  I have reviewed the triage vital signs and the nursing notes.  Pertinent labs & imaging results that were available during my care of the patient were reviewed by me and considered in my medical decision making (see chart for details).    27 year old female presents with respiratory symptoms.  Given persistent symptoms, I am placing her on doxycycline.  I believe she has uncontrolled asthma which is contributing.  Prednisone and albuterol ad prescribed.  Tessalon Perles for cough.  Final Clinical Impressions(s) / UC Diagnoses   Final diagnoses:  Respiratory infection  Exacerbation of asthma, unspecified asthma severity, unspecified whether persistent     Discharge Instructions     Meds as prescribed.  Take care  Dr. Adriana Simasook    ED Prescriptions    Medication Sig Dispense Auth. Provider   albuterol (PROVENTIL HFA;VENTOLIN HFA) 108 (90 Base) MCG/ACT inhaler Inhale 1-2 puffs into the lungs every 6 (six) hours as needed for wheezing or shortness of breath. 2 Inhaler Nickolai Rinks G, DO   benzonatate (TESSALON) 100 MG capsule Take 1 capsule (100 mg total) by mouth 3 (three) times daily as needed. 30 capsule [name] G, DO   predniSONE (DELTASONE) 50 MG tablet 1 tablet daily x 5 days 5 tablet [name] G, DO   doxycycline (VIBRAMYCIN) 100 MG capsule Take 1 capsule (100 mg total) by mouth 2 (two) times daily. 14 capsule [name], [name] G, DO     Controlled Substance  Prescriptions [place] Controlled Substance Registry consulted? Not Applicable   Tommie SamsCook, Nevin Grizzle G, DO 02/21/18 1350

## 2018-10-29 IMAGING — CR DG CHEST 2V
2 series · 2 of 2 positions shown · non-contrast
Comparison: 12/21/2010

CLINICAL DATA: Productive cough and fever for 1 month. Vomiting and
diarrhea for several days.

EXAM:
CHEST  2 VIEW

[chest pa]
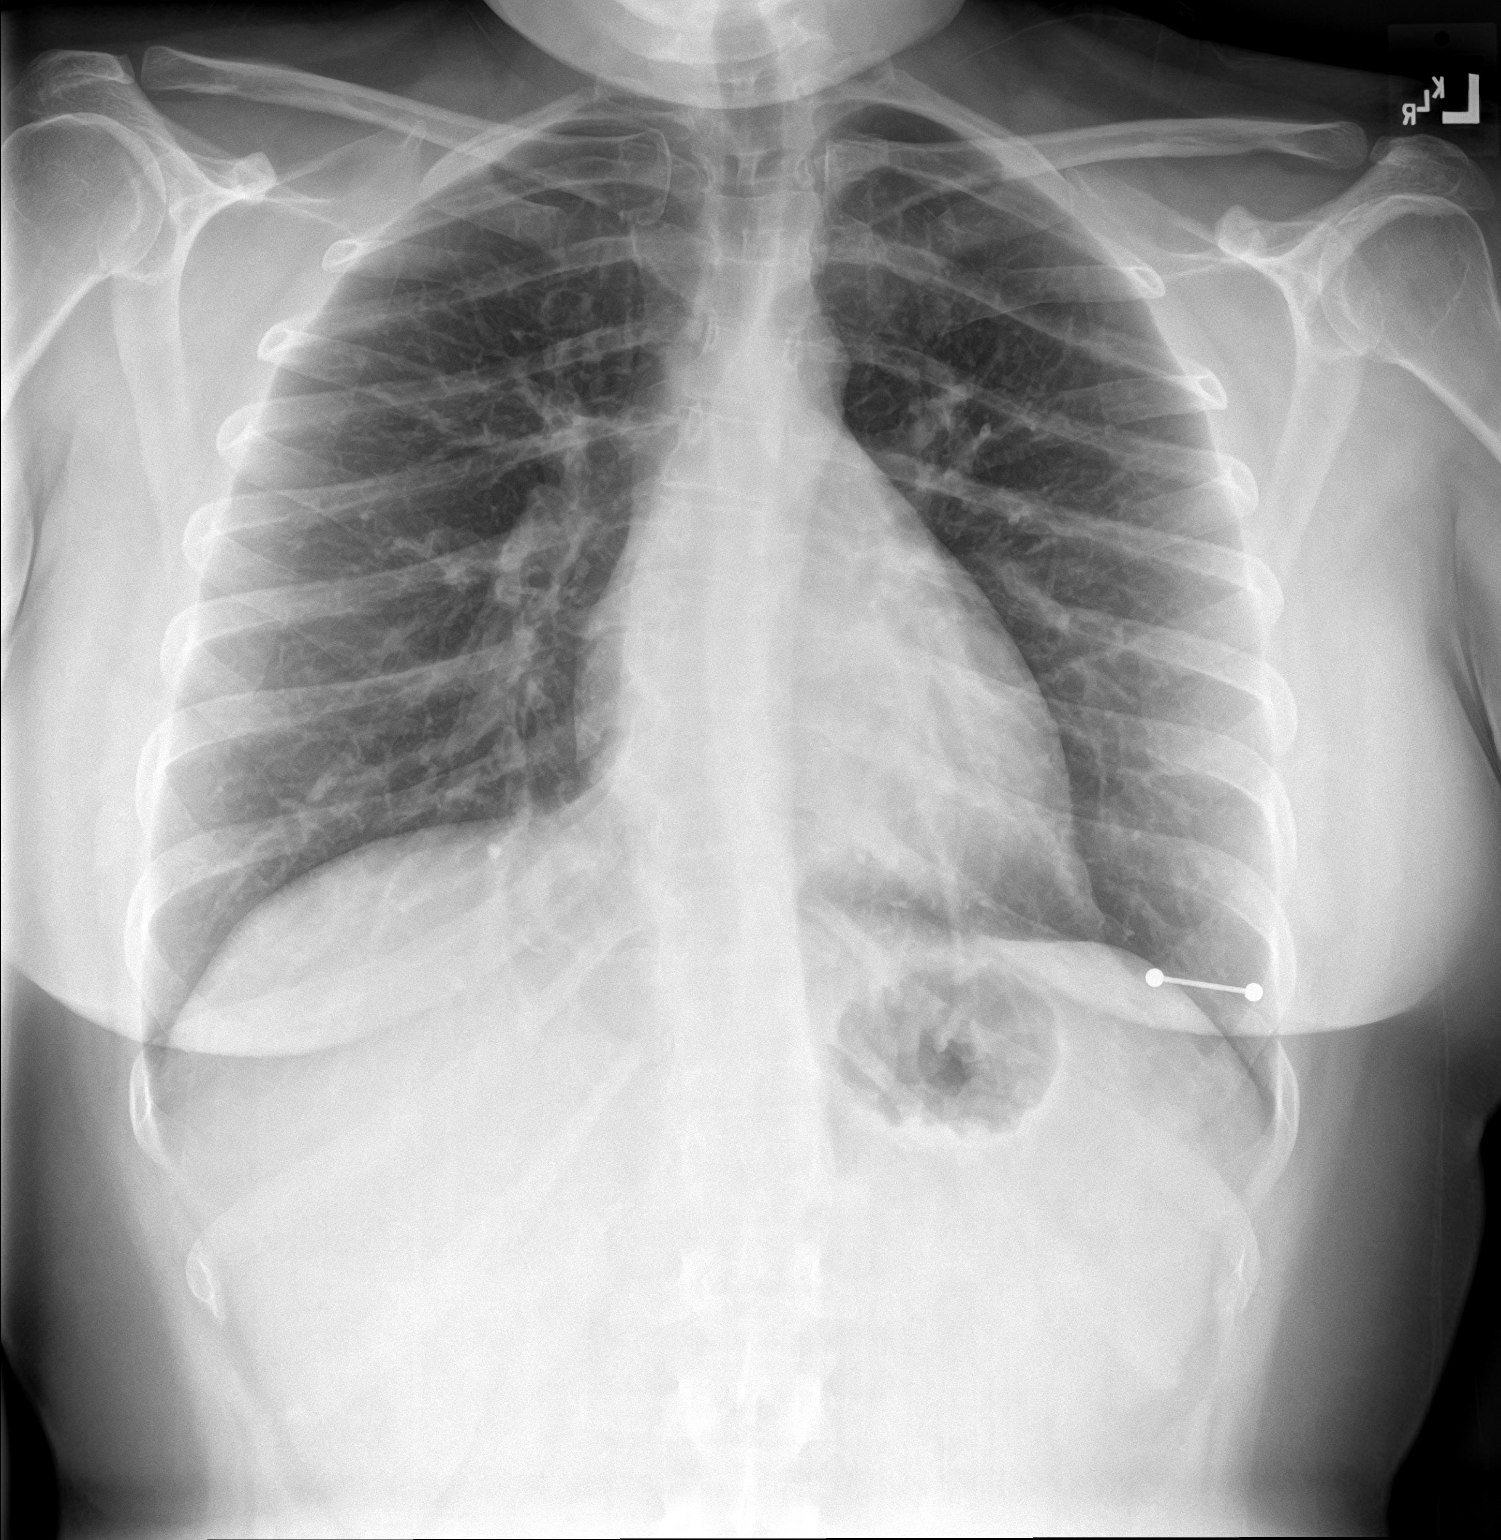

[chest lat]
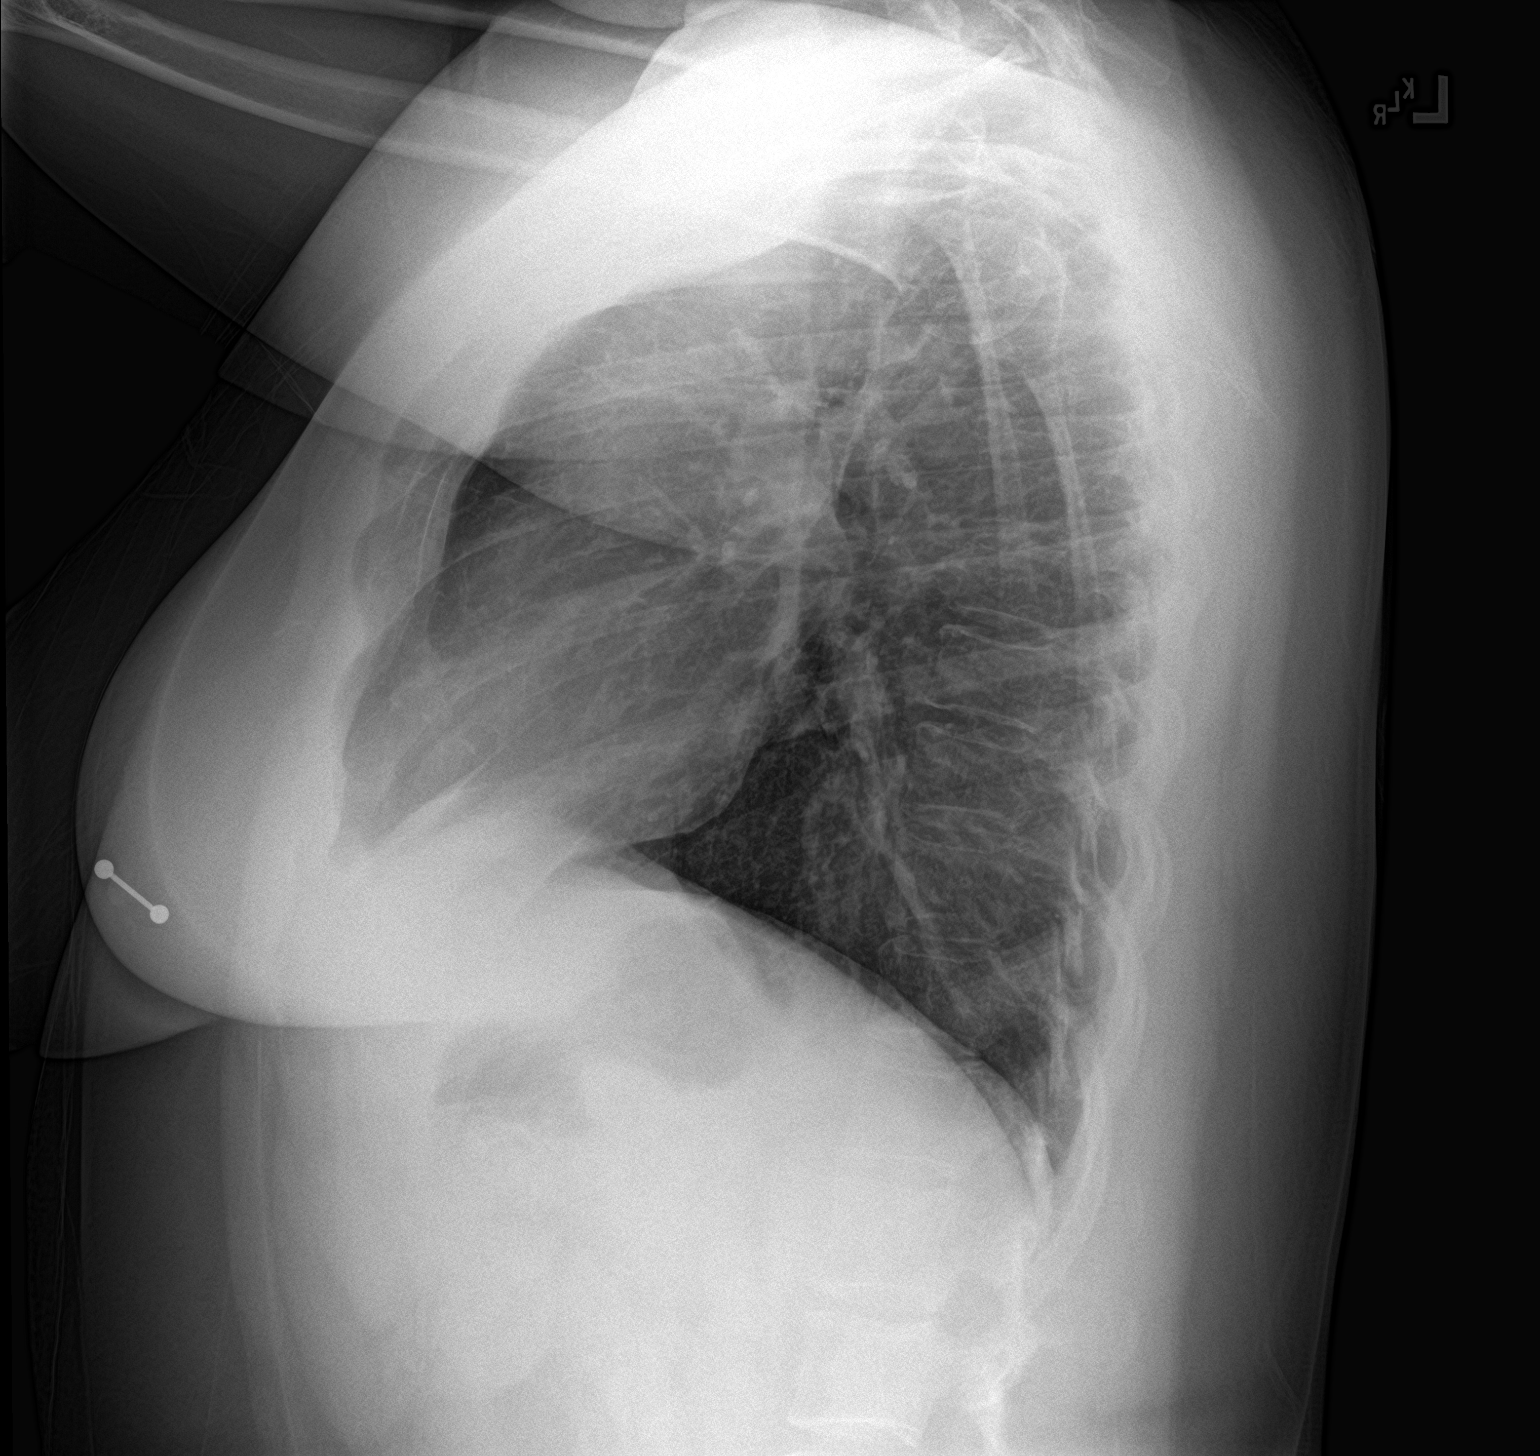

[2 of 2 positions shown; findings below may reference images not displayed]

FINDINGS: The heart size and mediastinal contours are within normal limits.
Both lungs are clear. Stable mild thoracic dextroscoliosis.
IMPRESSION: Negative.  No active cardiopulmonary disease.

## 2018-10-31 ENCOUNTER — Encounter: Payer: Self-pay | Admitting: Emergency Medicine

## 2018-10-31 ENCOUNTER — Emergency Department
Admission: EM | Admit: 2018-10-31 | Discharge: 2018-10-31 | Disposition: A | Discharge status: Home / Self Care | Payer: Self-pay | Attending: Emergency Medicine | Admitting: Emergency Medicine

## 2018-10-31 ENCOUNTER — Other Ambulatory Visit: Payer: Self-pay

## 2018-10-31 DIAGNOSIS — X58XXXA Exposure to other specified factors, initial encounter: Secondary | ICD-10-CM | POA: Insufficient documentation

## 2018-10-31 DIAGNOSIS — Y9389 Activity, other specified: Secondary | ICD-10-CM | POA: Insufficient documentation

## 2018-10-31 DIAGNOSIS — F17210 Nicotine dependence, cigarettes, uncomplicated: Secondary | ICD-10-CM | POA: Insufficient documentation

## 2018-10-31 DIAGNOSIS — S025XXA Fracture of tooth (traumatic), initial encounter for closed fracture: Secondary | ICD-10-CM | POA: Insufficient documentation

## 2018-10-31 DIAGNOSIS — K0889 Other specified disorders of teeth and supporting structures: Secondary | ICD-10-CM

## 2018-10-31 DIAGNOSIS — Y929 Unspecified place or not applicable: Secondary | ICD-10-CM | POA: Insufficient documentation

## 2018-10-31 DIAGNOSIS — Y999 Unspecified external cause status: Secondary | ICD-10-CM | POA: Insufficient documentation

## 2018-10-31 DIAGNOSIS — K029 Dental caries, unspecified: Secondary | ICD-10-CM | POA: Insufficient documentation

## 2018-10-31 DIAGNOSIS — J45909 Unspecified asthma, uncomplicated: Secondary | ICD-10-CM | POA: Insufficient documentation

## 2018-10-31 MED ORDER — LIDOCAINE VISCOUS HCL 2 % MT SOLN
15.0000 mL | Freq: Once | OROMUCOSAL | Status: AC
  Administered 2018-10-31: 13:00:00 15 mL via OROMUCOSAL
  Filled 2018-10-31: qty 15

## 2018-10-31 MED ORDER — AMOXICILLIN 500 MG PO CAPS
500.0000 mg | ORAL_CAPSULE | Freq: Once | ORAL | Status: AC
  Administered 2018-10-31: 13:00:00 500 mg via ORAL
  Filled 2018-10-31: qty 1

## 2018-10-31 MED ORDER — TRAMADOL HCL 50 MG PO TABS
50.0000 mg | ORAL_TABLET | Freq: Once | ORAL | Status: AC
  Administered 2018-10-31: 13:00:00 50 mg via ORAL
  Filled 2018-10-31: qty 1

## 2018-10-31 MED ORDER — LIDOCAINE-EPINEPHRINE 2 %-1:100000 IJ SOLN
1.7000 mL | Freq: Once | INTRAMUSCULAR | Status: DC
Start: 2018-10-31 — End: 2018-10-31
  Filled 2018-10-31: qty 1.7

## 2018-10-31 MED ORDER — AMOXICILLIN 500 MG PO CAPS: 500.0000 mg | ORAL_CAPSULE | Freq: Three times a day (TID) | ORAL | 0 refills | Status: DC

## 2018-10-31 MED ORDER — TRAMADOL HCL 50 MG PO TABS: 50.0000 mg | ORAL_TABLET | Freq: Three times a day (TID) | ORAL | 0 refills | Status: AC | PRN

## 2018-10-31 NOTE — ED Provider Notes (Signed)
[facility] Emergency Department Provider Note ____________________________________________  Time seen: 1250  I have reviewed the triage vital signs and the nursing notes.  HISTORY  Chief Complaint  Dental Pain  HPI Kendra Lindsey is a 28 y.o. female presents to the ED for evaluation of dental pain.   She reports poor dentition, and is noted brittle teeth over the last several years.  She experience some sudden breakage of the enamel to to upper teeth prior to arrival.  She presents with pain as well as sensitivity to cold air.  She denies any fevers, chills, sweats patient also denies any focal gum swelling, or purulent drainage.  Past Medical History:  Diagnosis Date  . Asthma     There are no active problems to display for this patient.   Past Surgical History:  Procedure Laterality Date  . NO PAST SURGERIES      Prior to Admission medications   Medication Sig Start Date End Date Taking? Authorizing Provider  albuterol (PROVENTIL HFA;VENTOLIN HFA) 108 (90 Base) MCG/ACT inhaler Inhale 1-2 puffs into the lungs every 6 (six) hours as needed for wheezing or shortness of breath. 02/21/18   [name], Jayce G, DO  amoxicillin (AMOXIL) 500 MG capsule Take 1 capsule (500 mg total) by mouth 3 (three) times daily. 10/31/18   Sareena Odeh, Charlesetta IvoryJenise V Bacon, PA-C  traMADol (ULTRAM) 50 MG tablet Take 1 tablet (50 mg total) by mouth 3 (three) times daily as needed for up to 3 days. 10/31/18 11/03/18  Bird Tailor, Charlesetta IvoryJenise V Bacon, PA-C    Allergies Patient has no known allergies.  Family History  Problem Relation Age of Onset  . Drug abuse Mother   . Scoliosis Mother   . Mental illness Mother     Social History Social History   Tobacco Use  . Smoking status: Current Every Day Smoker    Packs/day: 0.50    Types: Cigarettes  . Smokeless tobacco: Never Used  Substance Use Topics  . Alcohol use: Yes    Comment: socially  . Drug use: Yes    Types: Cocaine    Review of  Systems  Constitutional: Negative for fever. Eyes: Negative for visual changes. ENT: Negative for sore throat.  Dental pain as above. Cardiovascular: Negative for chest pain. Respiratory: Negative for shortness of breath. Gastrointestinal: Negative for abdominal pain, vomiting and diarrhea. Genitourinary: Negative for dysuria. Musculoskeletal: Negative for back pain. Skin: Negative for rash. Neurological: Negative for headaches, focal weakness or numbness. ____________________________________________  PHYSICAL EXAM:  VITAL SIGNS: ED Triage Vitals  Enc Vitals Group     BP 10/31/18 1208 126/90     Pulse Rate 10/31/18 1208 91     Resp 10/31/18 1208 17     Temp 10/31/18 1208 98.5 F (36.9 C)     Temp Source 10/31/18 1208 Oral     SpO2 10/31/18 1208 98 %     Weight 10/31/18 1209 180 lb (81.6 kg)     Height 10/31/18 1209 5\' 3"  (1.6 m)     Head Circumference --      Peak Flow --      Pain Score 10/31/18 1209 8     Pain Loc --      Pain Edu? --      Excl. in GC? --     Constitutional: Alert and oriented. Well appearing and in no distress. Head: Normocephalic and atraumatic. Eyes: Conjunctivae are normal. PERRL. Normal extraocular movements Ears: Canals clear. TMs intact bilaterally. Nose: No congestion/rhinorrhea/epistaxis.  Mouth/Throat: Mucous membranes are moist.  Uvula is midline and tonsils are flat.  No oropharyngeal lesions are appreciated.  Patient with multiple teeth with the enamel where exposing the underlying dentin.  No focal gum swelling is appreciated.  She localizes pain to the right upper secondary incisor, as well as the left upper premolar.  No brawny sublingual edema is noted. Neck: Supple. No thyromegaly. Hematological/Lymphatic/Immunological: No cervical lymphadenopathy. Cardiovascular: Normal rate, regular rhythm. Normal distal pulses. Respiratory: Normal respiratory effort.  Musculoskeletal: Nontender with normal range of motion in all extremities.   Neurologic:  Normal gait without ataxia. Normal speech and language. No gross focal neurologic deficits are appreciated. Skin:  Skin is warm, dry and intact. No rash noted. ____________________________________________  PROCEDURES  Viscous lido 2% gargle amoxicillin 500 mg PO Ultram 50 mg PO Procedures ____________________________________________  INITIAL IMPRESSION / ASSESSMENT AND PLAN / ED COURSE  Patient with ED evaluation of acute dental pain secondary to dental caries.  Patient's clinical picture is consistent with enamel wear and dentin exposure.  Pain to the upper teeth as indicated, without any focal gum swelling or focal abscess.  She will be treated empirically with amoxicillin and a small prescription for #10 Ultram.  She is given a list of dental providers to follow-up for definitive management.  Return precautions have been reviewed.  Kendra Lindsey was evaluated in Emergency Department on 10/31/2018 for the symptoms described in the history of present illness. She was evaluated in the context of the global COVID-19 pandemic, which necessitated consideration that the patient might be at risk for infection with the SARS-CoV-2 virus that causes COVID-19. Institutional protocols and algorithms that pertain to the evaluation of patients at risk for COVID-19 are in a state of rapid change based on information released by regulatory bodies including the CDC and federal and state organizations. These policies and algorithms were followed during the patient's care in the ED.  [place] Controlled Substances Database reviewed.  ____________________________________________  FINAL CLINICAL IMPRESSION(S) / ED DIAGNOSES  Final diagnoses:  Pain, dental  Closed fracture of tooth, initial encounter  Pain due to dental caries      Melvenia Needles, PA-C 10/31/18 1446    Vanessa Freeport, MD 11/01/18 [phone]

## 2018-10-31 NOTE — Discharge Instructions (Signed)
Take the antibiotic as directed. Take the pain medicine as needed. Follow-up with one of the dental clinics for definitive management.

## 2018-10-31 NOTE — ED Triage Notes (Signed)
Pt arrives with complaints of broken tooth and pain from it.

## 2018-11-09 ENCOUNTER — Ambulatory Visit: Admission: EM | Admit: 2018-11-09 | Discharge: 2018-11-09 | Discharge status: Against Medical Advice | Payer: Self-pay

## 2018-11-09 ENCOUNTER — Other Ambulatory Visit: Payer: Self-pay

## 2019-07-13 ENCOUNTER — Other Ambulatory Visit: Payer: Self-pay

## 2019-07-13 ENCOUNTER — Emergency Department: Payer: Self-pay

## 2019-07-13 DIAGNOSIS — J019 Acute sinusitis, unspecified: Secondary | ICD-10-CM | POA: Insufficient documentation

## 2019-07-13 DIAGNOSIS — K029 Dental caries, unspecified: Secondary | ICD-10-CM | POA: Insufficient documentation

## 2019-07-13 DIAGNOSIS — R220 Localized swelling, mass and lump, head: Secondary | ICD-10-CM | POA: Insufficient documentation

## 2019-07-13 DIAGNOSIS — F17210 Nicotine dependence, cigarettes, uncomplicated: Secondary | ICD-10-CM | POA: Insufficient documentation

## 2019-07-13 DIAGNOSIS — Z79899 Other long term (current) drug therapy: Secondary | ICD-10-CM | POA: Insufficient documentation

## 2019-07-13 LAB — BASIC METABOLIC PANEL
Anion gap: 10 (ref 5–15)
BUN: 13 mg/dL (ref 6–20)
CO2: 26 mmol/L (ref 22–32)
Calcium: 9.1 mg/dL (ref 8.9–10.3)
Chloride: 102 mmol/L (ref 98–111)
Creatinine, Ser: 0.52 mg/dL (ref 0.44–1.00)
GFR calc Af Amer: >60 mL/min (ref >60)
GFR calc non Af Amer: >60 mL/min (ref >60)
Glucose, Bld: 137 mg/dL — ABNORMAL HIGH (ref 70–99)
Potassium: 3.6 mmol/L (ref 3.5–5.1)
Sodium: 138 mmol/L (ref 135–145)

## 2019-07-13 LAB — CBC WITH DIFFERENTIAL/PLATELET
Abs Immature Granulocytes: 0.01 10*3/uL (ref 0.00–0.07)
Basophils Absolute: 0 10*3/uL (ref 0.0–0.1)
Basophils Relative: 1 %
Eosinophils Absolute: 0.8 10*3/uL — ABNORMAL HIGH (ref 0.0–0.5)
Eosinophils Relative: 10 %
HCT: 36.7 % (ref 36.0–46.0)
Hemoglobin: 12.5 g/dL (ref 12.0–15.0)
Immature Granulocytes: 0 %
Lymphocytes Relative: 31 %
Lymphs Abs: 2.5 10*3/uL (ref 0.7–4.0)
MCH: 31.3 pg (ref 26.0–34.0)
MCHC: 34.1 g/dL (ref 30.0–36.0)
MCV: 91.8 fL (ref 80.0–100.0)
Monocytes Absolute: 0.4 10*3/uL (ref 0.1–1.0)
Monocytes Relative: 5 %
Neutro Abs: 4.2 10*3/uL (ref 1.7–7.7)
Neutrophils Relative %: 53 %
Platelets: 389 10*3/uL (ref 150–400)
RBC: 4 MIL/uL (ref 3.87–5.11)
RDW: 11.9 % (ref 11.5–15.5)
WBC: 7.9 10*3/uL (ref 4.0–10.5)
nRBC: 0 % (ref 0.0–0.2)

## 2019-07-13 LAB — POCT PREGNANCY, URINE: Preg Test, Ur: NEGATIVE

## 2019-07-13 MED ORDER — ACETAMINOPHEN 500 MG PO TABS
1000.0000 mg | ORAL_TABLET | Freq: Once | ORAL | Status: DC
  Filled 2019-07-13: qty 2

## 2019-07-13 NOTE — ED Notes (Addendum)
Pt to triage, requesting 4 tylenol and 4 ibuprofen; explained to pt 4 tylenol is not a safe dosage and that I can give her 2 ES tylenol as requested; pt is yelling loudly, demanding to be seen; explained to pt that her results have been received and she is waiting on an available exam room to see the provider; pt is upset because people are "going before her"; explained to pt purpose of triage and acuity levels; pt remains upset and walks out of triage before receiving her meds

## 2019-07-13 NOTE — ED Triage Notes (Signed)
Pt to the er for swelling to the eyes, and cheeks and sinus congestion. Pt has pain over her face and in her head. Pt is unable to breathe through her nose. Pt denies any allergies to meds, foods or environment. Pt denies itching. Pt states she can feel pressure in her ears. Pt crying and reports severe pain. Pt states she took a percocet and a benadryl this morning. Pt has a hx of cocaine use, denies snorting anything in the last few days.

## 2019-07-14 ENCOUNTER — Emergency Department
Admission: EM | Admit: 2019-07-14 | Discharge: 2019-07-14 | Disposition: A | Discharge status: Home / Self Care | Payer: Self-pay | Attending: Emergency Medicine | Admitting: Emergency Medicine

## 2019-07-14 DIAGNOSIS — R220 Localized swelling, mass and lump, head: Secondary | ICD-10-CM

## 2019-07-14 DIAGNOSIS — J019 Acute sinusitis, unspecified: Secondary | ICD-10-CM

## 2019-07-14 DIAGNOSIS — K029 Dental caries, unspecified: Secondary | ICD-10-CM

## 2019-07-14 MED ORDER — DEXAMETHASONE SODIUM PHOSPHATE 10 MG/ML IJ SOLN
10.0000 mg | Freq: Once | INTRAMUSCULAR | Status: AC
  Administered 2019-07-14: 10 mg via INTRAMUSCULAR
  Filled 2019-07-14: qty 1

## 2019-07-14 MED ORDER — KETOROLAC TROMETHAMINE 60 MG/2ML IM SOLN
30.0000 mg | Freq: Once | INTRAMUSCULAR | Status: AC
  Administered 2019-07-14: 30 mg via INTRAMUSCULAR
  Filled 2019-07-14: qty 2

## 2019-07-14 MED ORDER — AMOXICILLIN-POT CLAVULANATE 875-125 MG PO TABS
1.0000 | ORAL_TABLET | Freq: Once | ORAL | Status: AC
  Administered 2019-07-14: 1 via ORAL
  Filled 2019-07-14: qty 1

## 2019-07-14 MED ORDER — NAPROXEN 500 MG PO TABS: 500.0000 mg | ORAL_TABLET | Freq: Two times a day (BID) | ORAL | 0 refills | Status: AC

## 2019-07-14 MED ORDER — OXYCODONE-ACETAMINOPHEN 5-325 MG PO TABS
1.0000 | ORAL_TABLET | Freq: Once | ORAL | Status: AC
  Administered 2019-07-14: 1 via ORAL
  Filled 2019-07-14: qty 1

## 2019-07-14 MED ORDER — AMOXICILLIN-POT CLAVULANATE 875-125 MG PO TABS: 1.0000 | ORAL_TABLET | Freq: Two times a day (BID) | ORAL | 0 refills | Status: DC

## 2019-07-14 NOTE — Discharge Instructions (Signed)
You must fill and take the antibiotic prescribed (Augmentin 875mg  twice daily x 7 days). Take Naprosyn as needed for pain. Apply cool compress to face several times daily. Stop snorting cocaine. Over time this can cause permanent damage to your nose. Return to the ER for worsening symptoms, persistent vomiting, difficulty breathing or other concerns.

## 2019-07-14 NOTE — ED Notes (Signed)
Pt reports changes to sinuses beginning two weeks ago with increased cocaine usage. Pt reports after mother passed, she began using again. Pt reports she experienced bleeding from the nose and tear ducts two nights ago  Pt reports "mushy feeling" to nasal septum, along with swelling and pain to face, nose, eyes. Pt also deports abscessed tooth

## 2019-07-14 NOTE — ED Provider Notes (Signed)
[facility] Emergency Department Provider Note   ____________________________________________   First MD Initiated Contact with Patient 07/14/19 0021     (approximate)  I have reviewed the triage vital signs and the nursing notes.   HISTORY  Chief Complaint No chief complaint on file.    HPI Kendra Lindsey is a 29 y.o. female who presents to the ED from home with a chief complaint of facial swelling x1 day.  Patient admits she has been "making bad choices" and snorting cocaine.  Last use 2 days ago.  Awoke yesterday morning with diffuse facial swelling.  Denies new foods, medications or exposures.  States she is congested and feeling pressure in her sinuses and ears.  Denies fever, cough, chest pain, shortness of breath, abdominal pain, nausea, vomiting or dizziness.      Past Medical History:  Diagnosis Date  . Asthma     There are no problems to display for this patient.   Past Surgical History:  Procedure Laterality Date  . NO PAST SURGERIES      Prior to Admission medications   Medication Sig Start Date End Date Taking? Authorizing Provider  albuterol (PROVENTIL HFA;VENTOLIN HFA) 108 (90 Base) MCG/ACT inhaler Inhale 1-2 puffs into the lungs every 6 (six) hours as needed for wheezing or shortness of breath. 02/21/18   Coral Spikes, DO  amoxicillin (AMOXIL) 500 MG capsule Take 1 capsule (500 mg total) by mouth 3 (three) times daily. 10/31/18   Menshew, Dannielle Karvonen, PA-C  amoxicillin-clavulanate (AUGMENTIN) 875-125 MG tablet Take 1 tablet by mouth 2 (two) times daily. 07/14/19   Paulette Blanch, MD  naproxen (NAPROSYN) 500 MG tablet Take 1 tablet (500 mg total) by mouth 2 (two) times daily with a meal. 07/14/19   Paulette Blanch, MD    Allergies Patient has no known allergies.  Family History  Problem Relation Age of Onset  . Drug abuse Mother   . Scoliosis Mother   . Mental illness Mother     Social History Social History   Tobacco Use    . Smoking status: Current Every Day Smoker    Packs/day: 0.50    Types: Cigarettes  . Smokeless tobacco: Never Used  Substance Use Topics  . Alcohol use: Yes    Comment: socially  . Drug use: Yes    Types: Cocaine    Review of Systems  Constitutional: No fever/chills Eyes: No visual changes. ENT: Positive for facial swelling and sinus pressure.  No sore throat. Cardiovascular: Denies chest pain. Respiratory: Denies shortness of breath. Gastrointestinal: No abdominal pain.  No nausea, no vomiting.  No diarrhea.  No constipation. Genitourinary: Negative for dysuria. Musculoskeletal: Negative for back pain. Skin: Negative for rash. Neurological: Negative for headaches, focal weakness or numbness.   ____________________________________________   PHYSICAL EXAM:  VITAL SIGNS: ED Triage Vitals  Enc Vitals Group     BP --      Pulse Rate 07/13/19 2043 97     Resp 07/13/19 2043 20     Temp 07/13/19 2043 99.2 F (37.3 C)     Temp Source 07/13/19 2043 Oral     SpO2 07/13/19 2043 98 %     Weight 07/13/19 2044 185 lb (83.9 kg)     Height 07/13/19 2044 5' 3.5" (1.613 m)     Head Circumference --      Peak Flow --      Pain Score 07/13/19 2043 10     Pain  Loc --      Pain Edu? --      Excl. in [place]? --     Constitutional: Alert and oriented. Well appearing and in mild acute distress. Eyes: Conjunctivae are normal. PERRL. EOMI. Head: Atraumatic.  Mild facial swelling. Nose: No septal deformity.  No bleeding.  Congested. Mouth/Throat: Mucous membranes are moist.  Left upper teeth decayed and fractured to the gumline which is irritated. Neck: No stridor.  Supple neck without meningismus. Cardiovascular: Normal rate, regular rhythm. Grossly normal heart sounds.  Good peripheral circulation. Respiratory: Normal respiratory effort.  No retractions. Lungs CTAB. Gastrointestinal: Soft and nontender. No distention. No abdominal bruits. No CVA tenderness. Musculoskeletal: No lower  extremity tenderness nor edema.  No joint effusions. Neurologic:  Normal speech and language. No gross focal neurologic deficits are appreciated. No gait instability. Skin:  Skin is warm, dry and intact. No rash noted.  No petechiae. Psychiatric: Mood and affect are normal. Speech and behavior are normal.  ____________________________________________   LABS (all labs ordered are listed, but only abnormal results are displayed)  Labs Reviewed  CBC WITH DIFFERENTIAL/PLATELET - Abnormal; Notable for the following components:      Result Value   Eosinophils Absolute 0.8 (*)    All other components within normal limits  BASIC METABOLIC PANEL - Abnormal; Notable for the following components:   Glucose, Bld 137 (*)    All other components within normal limits  POC URINE PREG, ED  POCT PREGNANCY, URINE   ____________________________________________  EKG  None ____________________________________________  RADIOLOGY  ED MD interpretation: No soft tissue abnormality; possible acute sinusitis  Official radiology report(s): CT Maxillofacial Wo Contrast  Result Date: 07/13/2019 CLINICAL DATA:  Swelling in the eyes and cheeks. Sinus congestion. Facial pain EXAM: CT MAXILLOFACIAL WITHOUT CONTRAST TECHNIQUE: Multidetector CT imaging of the maxillofacial structures was performed. Multiplanar CT image reconstructions were also generated. COMPARISON:  None. FINDINGS: Osseous: No fracture or mandibular dislocation. No destructive process. Orbits: Negative. No traumatic or inflammatory finding. Sinuses: Mucosal thickening within the maxillary sinuses, ethmoid air cells and nasal pharynx. Small air-fluid level in the right sphenoid sinus. Soft tissues: Negative. Limited intracranial: No significant or unexpected finding. IMPRESSION: No soft tissue abnormality in the face or orbits. Chronic sinusitis changes. Short air-fluid level in the right sphenoid sinus may reflect early superimposed acute sinusitis.  Electronically Signed   By: Charlett Nose M.D.   On: 07/13/2019 21:36    ____________________________________________   PROCEDURES  Procedure(s) performed (including Critical Care):  Procedures   ____________________________________________   INITIAL IMPRESSION / ASSESSMENT AND PLAN / ED COURSE  As part of my medical decision making, I reviewed the following data within the electronic MEDICAL RECORD NUMBER Nursing notes reviewed and incorporated, Labs reviewed, Old chart reviewed, Radiograph reviewed, Notes from prior ED visits and  Controlled Substance Database     Kendra Lindsey was evaluated in Emergency Department on 07/14/2019 for the symptoms described in the history of present illness. She was evaluated in the context of the global COVID-19 pandemic, which necessitated consideration that the patient might be at risk for infection with the SARS-CoV-2 virus that causes COVID-19. Institutional protocols and algorithms that pertain to the evaluation of patients at risk for COVID-19 are in a state of rapid change based on information released by regulatory bodies including the CDC and federal and state organizations. These policies and algorithms were followed during the patient's care in the ED.    29 year old female presenting with facial swelling, dental  decay; history of snorting cocaine.  Differential diagnosis includes but is not limited to sinusitis, cellulitis, abscess, etc.  Laboratory results unremarkable.  CT does not demonstrate abscess.  Will place on Augmentin.  Administer IM Toradol, Decadron, Percocet.  Discharged home on Augmentin and Naprosyn to use as needed.  Strict return precautions given.  Patient verbalizes understanding and agrees with plan of care.      ____________________________________________   FINAL CLINICAL IMPRESSION(S) / ED DIAGNOSES  Final diagnoses:  Facial swelling  Acute sinusitis, recurrence not specified, unspecified location  Dental  caries     ED Discharge Orders         Ordered    amoxicillin-clavulanate (AUGMENTIN) 875-125 MG tablet  2 times daily     07/14/19 0029    naproxen (NAPROSYN) 500 MG tablet  2 times daily with meals     07/14/19 0030           Note:  This document was prepared using Dragon voice recognition software and may include unintentional dictation errors.   Irean Hong, MD 07/14/19 [phone]

## 2019-08-18 ENCOUNTER — Emergency Department
Admission: EM | Admit: 2019-08-18 | Discharge: 2019-08-18 | Disposition: A | Discharge status: Home / Self Care | Payer: Self-pay | Attending: Emergency Medicine | Admitting: Emergency Medicine

## 2019-08-18 ENCOUNTER — Other Ambulatory Visit: Payer: Self-pay

## 2019-08-18 ENCOUNTER — Encounter: Payer: Self-pay | Admitting: Physician Assistant

## 2019-08-18 DIAGNOSIS — F17210 Nicotine dependence, cigarettes, uncomplicated: Secondary | ICD-10-CM | POA: Insufficient documentation

## 2019-08-18 DIAGNOSIS — K029 Dental caries, unspecified: Secondary | ICD-10-CM | POA: Insufficient documentation

## 2019-08-18 DIAGNOSIS — K047 Periapical abscess without sinus: Secondary | ICD-10-CM | POA: Insufficient documentation

## 2019-08-18 MED ORDER — AMOXICILLIN 500 MG PO CAPS: 500.0000 mg | ORAL_CAPSULE | Freq: Three times a day (TID) | ORAL | 0 refills | Status: AC

## 2019-08-18 MED ORDER — KETOROLAC TROMETHAMINE 30 MG/ML IJ SOLN
30.0000 mg | Freq: Once | INTRAMUSCULAR | Status: AC
  Administered 2019-08-18: 30 mg via INTRAMUSCULAR
  Filled 2019-08-18: qty 1

## 2019-08-18 MED ORDER — HYDROCODONE-ACETAMINOPHEN 5-325 MG PO TABS: 1.0000 | ORAL_TABLET | Freq: Three times a day (TID) | ORAL | 0 refills | Status: AC | PRN

## 2019-08-18 MED ORDER — KETOROLAC TROMETHAMINE 10 MG PO TABS: 10.0000 mg | ORAL_TABLET | Freq: Three times a day (TID) | ORAL | 0 refills | Status: AC

## 2019-08-18 MED ORDER — AMOXICILLIN 500 MG PO CAPS
500.0000 mg | ORAL_CAPSULE | Freq: Once | ORAL | Status: AC
  Administered 2019-08-18: 500 mg via ORAL
  Filled 2019-08-18: qty 1

## 2019-08-18 NOTE — ED Triage Notes (Signed)
Pt was recently treated for dental abscess, states woke up today with worsening left jaw pain and swelling.

## 2019-08-18 NOTE — Discharge Instructions (Addendum)
OPTIONS FOR DENTAL FOLLOW UP CARE ° °[place] Department [facility] Health and Human Services - Local Safety Net Dental Clinics °http://www.ncdhhs.gov/dph/oralhealth/services/safetynetclinics.htm °  °[facility] (336-562-3123) ° °[facility] [place] ([phone]) ° °[facility] ([phone] ext [phone]) ° °[facility] ([phone]) ° °[facility] ([phone]) °This clinic caters to the indigent population and is on a lottery system. °Location: °[facility], [address], [place] °Clinic Hours: °Wednesdays from 6pm - 9pm, patients seen by a lottery system. °For dates, call or go to [email or web address].[email or web address]/[facility]/patients/Dental-[email or web address] °Services: °Cleanings, fillings and simple extractions. °Payment Options: °DENTAL WORK IS FREE OF CHARGE. Bring proof of income or support. °Best way to get seen: °Arrive at 5:15 pm - this is a lottery, NOT first come/first serve, so arriving earlier will not increase your chances of being seen. °  °  °[facility] °[phone] °Select option 1 for emergencies °  °Location: °[facility], [address], [place] °Clinic Hours: °No walk-ins accepted - call the day before to schedule an appointment. °Check in times are 9:30 am and 1:30 pm. °Services: °Simple extractions, temporary fillings, pulpectomy/pulp debridement, uncomplicated abscess drainage. °Payment Options: °PAYMENT IS DUE AT THE TIME OF SERVICE.  Fee is usually $100-200, additional surgical procedures (e.g. abscess drainage) may be extra. °Cash, checks, Visa/MasterCard accepted.  Can file Medicaid if patient is covered for dental - patient should call case worker to check. °No discount for [facility] patients. °Best way to get seen: °MUST call the day before and get onto the schedule. Can usually be seen the next 1-2 days. No walk-ins accepted. °  °  °[facility] °[phone] °   °Location: °[facility], [address], [place] °Clinic Hours: °M, W, Th, F 8am or 1:30pm, Tues 9a or 1:30 - first come/first served. °Services: °Simple extractions, temporary fillings, uncomplicated abscess drainage.  You do not need to be an [place] resident. °Payment Options: °PAYMENT IS DUE AT THE TIME OF SERVICE. °Dental insurance, otherwise sliding scale - bring proof of income or support. °Depending on income and treatment needed, cost is usually $50-200. °Best way to get seen: °Arrive early as it is first come/first served. °  °  °[facility] °[phone] °  °Location: °[address] °Clinic Hours: °Mon-Thu 8a-5p °Services: °Most basic dental services including extractions and fillings. °Payment Options: °PAYMENT IS DUE AT THE TIME OF SERVICE. °Sliding scale, up to 50% off - bring proof if income or support. °Medicaid with dental option accepted. °Best way to get seen: °Call to schedule an appointment, can usually be seen within 2 weeks OR they will try to see walk-ins - show up at 8a or 2p (you may have to wait). °  °  °[facility] °[phone] °[place] RESIDENTS ONLY °  °Location: °[facility], [address], [place], [place] [address] °Clinic Hours: By appointment only. °Monday - Thursday 8am-5pm, Friday 8am-12pm °Services: Cleanings, fillings, extractions. °Payment Options: °PAYMENT IS DUE AT THE TIME OF SERVICE. °Cash, Visa or MasterCard. Sliding scale - $30 minimum per service. °Best way to get seen: °Come in to office, complete packet and make an appointment - need proof of income °or support monies for each household member and proof of [place] residence. °Usually takes about a month to get in. °  °  °[facility] °[phone] °  °Location: °[address]   [place] °Clinic Hours: Walk-in Urgent Care Dental Services are offered Monday-Friday  mornings only. °The numbers of emergencies accepted daily is limited to the number of °providers available. °Maximum 15 - Mondays, Wednesdays & Thursdays °Maximum 10 - Tuesdays & Fridays °Services: °You do not need to be a [place] resident to be seen for a dental emergency. °Emergencies are defined as pain, swelling, abnormal bleeding, or dental trauma. Walkins will receive x-rays if needed. °NOTE: Dental cleaning is not an emergency. °Payment Options: °PAYMENT IS DUE AT THE TIME OF SERVICE. °Minimum co-pay is $40.00 for uninsured patients. °Minimum co-pay is $3.00 for Medicaid with dental coverage. °Dental Insurance is accepted and must be presented at time of visit. °Medicare does not cover dental. °Forms of payment: Cash, credit card, checks. °Best way to get seen: °If not previously registered with the clinic, walk-in dental registration begins at 7:15 am and is on a first come/first serve basis. °If previously registered with the clinic, call to make an appointment. °  °  °[facility] °[phone] °[place] RESIDENTS ONLY °  °Location: °[address], [place], [place] °Clinic Hours: °Mon-Thu 10a-2p °Services: Extractions only! °Payment Options: °FREE (donations accepted) - bring proof of income or support °Best way to get seen: °Call and schedule an appointment OR come at 8am on the 1st Monday of every month (except for holidays) when it is first come/first served. °  °  °[facility] °[phone] °  °Location: °[address], [place] °Clinic Hours: °Friday mornings °Services, Payment Options, Best way to get seen: °Call for info °

## 2019-08-18 NOTE — ED Notes (Signed)
Pt to the er for swelling to the left side of the face at the cheek. Pt has several broken teeth on the upper left side. Pt has had multiple infections r/t her teeth. Pt says she has no insurance and is unable to get all of her teeth pulled as the last dentist instructed her.

## 2019-08-18 NOTE — ED Provider Notes (Signed)
[facility] Emergency Department Provider Note ____________________________________________  Time seen: 2029  I have reviewed the triage vital signs and the nursing notes.  HISTORY  Chief Complaint  Dental Pain  HPI Kendra Lindsey is a 29 y.o. female presents to the ED, for evaluation of dental pain and swelling.  Patient with a history of poor dentition secondary to brittle enamel, presents with swelling to the left side of the face patient denies any interim fever, chills, sweats.  She also denies any pain upon any change.  She had no difficulty controlling secretions or eating, drinking, swallowing.  She has not been successful and securing definitive dental extraction appointment with any local dental providers.  She is again requesting a list of providers.  Past Medical History:  Diagnosis Date  . Asthma     There are no problems to display for this patient.   Past Surgical History:  Procedure Laterality Date  . NO PAST SURGERIES      Prior to Admission medications   Medication Sig Start Date End Date Taking? Authorizing Provider  albuterol (PROVENTIL HFA;VENTOLIN HFA) 108 (90 Base) MCG/ACT inhaler Inhale 1-2 puffs into the lungs every 6 (six) hours as needed for wheezing or shortness of breath. 02/21/18   Tommie Sams, DO  amoxicillin (AMOXIL) 500 MG capsule Take 1 capsule (500 mg total) by mouth 3 (three) times daily. 08/18/19   Teshaun Olarte, Charlesetta Ivory, PA-C  HYDROcodone-acetaminophen (NORCO) 5-325 MG tablet Take 1 tablet by mouth 3 (three) times daily as needed for up to 3 days. 08/18/19 08/21/19  Maevis Mumby, Charlesetta Ivory, PA-C  ketorolac (TORADOL) 10 MG tablet Take 1 tablet (10 mg total) by mouth every 8 (eight) hours. 08/18/19   Deaundre Allston, Charlesetta Ivory, PA-C  naproxen (NAPROSYN) 500 MG tablet Take 1 tablet (500 mg total) by mouth 2 (two) times daily with a meal. 07/14/19   Irean Hong, MD    Allergies Patient has no known allergies.  Family  History  Problem Relation Age of Onset  . Drug abuse Mother   . Scoliosis Mother   . Mental illness Mother     Social History Social History   Tobacco Use  . Smoking status: Current Every Day Smoker    Packs/day: 0.50    Types: Cigarettes  . Smokeless tobacco: Never Used  Vaping Use  . Vaping Use: Never used  Substance Use Topics  . Alcohol use: Yes    Comment: socially  . Drug use: Yes    Types: Cocaine    Review of Systems  Constitutional: Negative for fever. Eyes: Negative for visual changes. ENT: Negative for sore throat.  Dental pain and swelling as above. Respiratory: Negative for shortness of breath. Gastrointestinal: Negative for abdominal pain, vomiting and diarrhea. Musculoskeletal: Negative for back pain. Skin: Negative for rash. Neurological: Negative for headaches, focal weakness or numbness. ____________________________________________  PHYSICAL EXAM:  VITAL SIGNS: ED Triage Vitals  Enc Vitals Group     BP 08/18/19 1912 136/87     Pulse Rate 08/18/19 1912 80     Resp --      Temp 08/18/19 1912 97.9 F (36.6 C)     Temp Source 08/18/19 1912 Oral     SpO2 08/18/19 1912 96 %     Weight 08/18/19 1915 185 lb (83.9 kg)     Height 08/18/19 1915 5\' 3"  (1.6 m)     Head Circumference --      Peak Flow --  Pain Score 08/18/19 1915 2     Pain Loc --      Pain Edu? --      Excl. in [place]? --     Constitutional: Alert and oriented. Well appearing and in no distress. Head: Normocephalic and atraumatic.Soft tissue swelling to the left nasolabial fold.  Eyes: Conjunctivae are normal. Normal extraocular movements Ears: Canals clear. TMs intact bilaterally. Nose: No congestion/rhinorrhea/epistaxis. Mouth/Throat: Mucous membranes are moist.  Uvula is midline and tonsils are flat.  No oropharyngeal lesions appreciated.  Patient with some subtle soft tissue swelling to the upper left quadrant.  Multiple teeth with enamel and dentin where to the gumline.  The  left upper premolar with a small buccal pustule noted.  No brawny sublingual edema is appreciated. Neck: Supple. No thyromegaly. Hematological/Lymphatic/Immunological: No cervical lymphadenopathy. Cardiovascular: Normal rate, regular rhythm. Normal distal pulses. Respiratory: Normal respiratory effort. No wheezes/rales/rhonchi. Musculoskeletal: Nontender with normal range of motion in all extremities.  Neurologic:  Normal gait without ataxia. Normal speech and language. No gross focal neurologic deficits are appreciated. Skin:  Skin is warm, dry and intact. No rash noted. ____________________________________________  PROCEDURES  Toradol 30 mg IM Amoxicillin 500 mg PO  Procedures ____________________________________________  INITIAL IMPRESSION / ASSESSMENT AND PLAN / ED COURSE  Patient with ED evaluation of dental pain secondary to dental caries.  She has been treated for an acute dental abscess.  Prescription for amoxicillin, Norco, and Toradol was provided at this time.  A list of providers also given for the patient to call for definitive management.  Return precautions have been reviewed.  Kendra Lindsey was evaluated in Emergency Department on 08/18/2019 for the symptoms described in the history of present illness. She was evaluated in the context of the global COVID-19 pandemic, which necessitated consideration that the patient might be at risk for infection with the SARS-CoV-2 virus that causes COVID-19. Institutional protocols and algorithms that pertain to the evaluation of patients at risk for COVID-19 are in a state of rapid change based on information released by regulatory bodies including the CDC and federal and state organizations. These policies and algorithms were followed during the patient's care in the ED.  I reviewed the patient's prescription history over the last 12 months in the multi-state controlled substances database(s) that includes [place], [place], [place],  [place], [place], [place], [place], [place], [place], [place], [place], [place], [place], and [place].  Results were notable for RX history noted. ____________________________________________  FINAL CLINICAL IMPRESSION(S) / ED DIAGNOSES  Final diagnoses:  Pain due to dental caries  Dental abscess      Melvenia Needles, PA-C 08/18/19 2059    Carrie Mew, MD 08/18/19 2245

## 2019-11-21 ENCOUNTER — Emergency Department (HOSPITAL_COMMUNITY)
Admission: EM | Admit: 2019-11-21 | Discharge: 2019-11-21 | Disposition: A | Discharge status: Home / Self Care | Payer: Self-pay | Attending: Emergency Medicine | Admitting: Emergency Medicine

## 2019-11-21 ENCOUNTER — Encounter (HOSPITAL_COMMUNITY): Payer: Self-pay | Admitting: Emergency Medicine

## 2019-11-21 ENCOUNTER — Other Ambulatory Visit: Payer: Self-pay

## 2019-11-21 DIAGNOSIS — M273 Alveolitis of jaws: Secondary | ICD-10-CM | POA: Insufficient documentation

## 2019-11-21 DIAGNOSIS — Z79899 Other long term (current) drug therapy: Secondary | ICD-10-CM | POA: Insufficient documentation

## 2019-11-21 DIAGNOSIS — F17210 Nicotine dependence, cigarettes, uncomplicated: Secondary | ICD-10-CM | POA: Insufficient documentation

## 2019-11-21 DIAGNOSIS — J45909 Unspecified asthma, uncomplicated: Secondary | ICD-10-CM | POA: Insufficient documentation

## 2019-11-21 MED ORDER — OXYCODONE HCL 5 MG PO TABS
5.0000 mg | ORAL_TABLET | Freq: Once | ORAL | Status: AC
  Administered 2019-11-21: 5 mg via ORAL
  Filled 2019-11-21: qty 1

## 2019-11-21 NOTE — ED Triage Notes (Signed)
Pt reports having multiple teeth extracted in the past week at [facility].  States she is having severe pain.

## 2019-11-21 NOTE — ED Notes (Signed)
Patient verbalizes understanding of discharge instructions. Opportunity for questioning and answers were provided. Armband removed by staff, pt discharged from ED.  

## 2019-11-21 NOTE — ED Provider Notes (Signed)
[facility] EMERGENCY DEPARTMENT Provider Note   CSN: [id number] Arrival date & time: 11/21/19  1356     History Chief Complaint  Patient presents with  . Dental Pain    Kendra Lindsey is a 29 y.o. female.  The history is provided by the patient.  Dental Pain  Kendra Lindsey is a 29 y.o. female who presents to the Emergency Department complaining of dental pain. She presents the emergency department complaining of several days of severe dental pain. She has been going to the [facility] student dental clinic for extractions. She had her lower teeth extracted a few weeks ago and last week she had her upper right teeth extracted. She feels like the stitches are loose and she has severe pain to her mouth over the right side of her jaw. There was gauze placed in one of the cavities but the gauze fell out. Her pain significantly worsened after that time. No fevers. Pain is worse with breathing and movement. She was previously on Suboxone but stopped taking it three weeks ago. She does smoke tobacco. She has been taking ibuprofen and Tylenol at home for pain.    Past Medical History:  Diagnosis Date  . Asthma     There are no problems to display for this patient.   Past Surgical History:  Procedure Laterality Date  . NO PAST SURGERIES       OB History   No obstetric history on file.     Family History  Problem Relation Age of Onset  . Drug abuse Mother   . Scoliosis Mother   . Mental illness Mother     Social History   Tobacco Use  . Smoking status: Current Every Day Smoker    Packs/day: 0.50    Types: Cigarettes  . Smokeless tobacco: Never Used  Vaping Use  . Vaping Use: Never used  Substance Use Topics  . Alcohol use: Yes    Comment: socially  . Drug use: Yes    Types: Cocaine    Home Medications Prior to Admission medications   Medication Sig Start Date End Date Taking? Authorizing Provider  albuterol (PROVENTIL HFA;VENTOLIN HFA) 108 (90  Base) MCG/ACT inhaler Inhale 1-2 puffs into the lungs every 6 (six) hours as needed for wheezing or shortness of breath. 02/21/18   Tommie Sams, DO  amoxicillin (AMOXIL) 500 MG capsule Take 1 capsule (500 mg total) by mouth 3 (three) times daily. 08/18/19   Menshew, Charlesetta Ivory, PA-C  ketorolac (TORADOL) 10 MG tablet Take 1 tablet (10 mg total) by mouth every 8 (eight) hours. 08/18/19   Menshew, Charlesetta Ivory, PA-C  naproxen (NAPROSYN) 500 MG tablet Take 1 tablet (500 mg total) by mouth 2 (two) times daily with a meal. 07/14/19   Irean Hong, MD    Allergies    Patient has no known allergies.  Review of Systems   Review of Systems  All other systems reviewed and are negative.   Physical Exam Updated Vital Signs BP 138/79 (BP Location: Left Arm)   Pulse 85   Temp 98.4 F (36.9 C) (Oral)   Resp 18   LMP 10/26/2019   SpO2 100%   Physical Exam Vitals and nursing note reviewed.  Constitutional:      Appearance: She is well-developed.  HENT:     Head: Normocephalic and atraumatic.     Comments: Multiple carries lower posterior teeth been extracted with clean and healing gingiva. The right upper  posterior teeth have been extracted. There are sutures in place and an empty socket in the right upper mid jaw. There is no surrounding edema or erythema. Cardiovascular:     Rate and Rhythm: Normal rate and regular rhythm.  Pulmonary:     Effort: Pulmonary effort is normal. No respiratory distress.  Musculoskeletal:        General: No tenderness.  Skin:    General: Skin is warm and dry.  Neurological:     Mental Status: She is alert and oriented to person, place, and time.  Psychiatric:        Behavior: Behavior normal.     ED Results / Procedures / Treatments   Labs (all labs ordered are listed, but only abnormal results are displayed) Labs Reviewed - No data to display  EKG None  Radiology No results found.  Procedures Procedures (including critical care  time)  Medications Ordered in ED Medications  oxyCODONE (Oxy IR/ROXICODONE) immediate release tablet 5 mg (has no administration in time range)    ED Course  I have reviewed the triage vital signs and the nursing notes.  Pertinent labs & imaging results that were available during my care of the patient were reviewed by me and considered in my medical decision making (see chart for details).    MDM Rules/Calculators/A&P                         Patient here for evaluation of dental pain after having multiple extractions at [facility] student dental clinic. She is non-toxic appearing on evaluation with no overt evidence of infection. Examination is concerning for dry socket. Dry socket paste applied to the area with no significant change in her symptoms. She was provided with a one-time dose of oxycodone for her pain. Given her history of Suboxone use do not feel comfortable prescribing a narcotic, query of the dea database states that her last fill of Suboxone was on a September 22. Discussed with patient managing her pain with saline rinses, OTC Tylenol and ibuprofen. Discussed dentistry follow-up.  Final Clinical Impression(s) / ED Diagnoses Final diagnoses:  Dry socket    Rx / DC Orders ED Discharge Orders    None       Tilden Fossa, MD 11/21/19 1806

## 2020-08-23 ENCOUNTER — Other Ambulatory Visit: Payer: Self-pay

## 2020-08-23 ENCOUNTER — Emergency Department
Admission: EM | Admit: 2020-08-23 | Discharge: 2020-08-23 | Disposition: A | Discharge status: Home / Self Care | Payer: Self-pay | Attending: Emergency Medicine | Admitting: Emergency Medicine

## 2020-08-23 ENCOUNTER — Encounter: Payer: Self-pay | Admitting: *Deleted

## 2020-08-23 ENCOUNTER — Emergency Department: Payer: Self-pay

## 2020-08-23 DIAGNOSIS — J45909 Unspecified asthma, uncomplicated: Secondary | ICD-10-CM | POA: Insufficient documentation

## 2020-08-23 DIAGNOSIS — E876 Hypokalemia: Secondary | ICD-10-CM | POA: Insufficient documentation

## 2020-08-23 DIAGNOSIS — F17210 Nicotine dependence, cigarettes, uncomplicated: Secondary | ICD-10-CM | POA: Insufficient documentation

## 2020-08-23 DIAGNOSIS — R112 Nausea with vomiting, unspecified: Secondary | ICD-10-CM | POA: Insufficient documentation

## 2020-08-23 DIAGNOSIS — T402X1A Poisoning by other opioids, accidental (unintentional), initial encounter: Secondary | ICD-10-CM | POA: Insufficient documentation

## 2020-08-23 DIAGNOSIS — D72829 Elevated white blood cell count, unspecified: Secondary | ICD-10-CM | POA: Insufficient documentation

## 2020-08-23 LAB — CBC
HCT: 34.1 % — ABNORMAL LOW (ref 36.0–46.0)
Hemoglobin: 12 g/dL (ref 12.0–15.0)
MCH: 31.7 pg (ref 26.0–34.0)
MCHC: 35.2 g/dL (ref 30.0–36.0)
MCV: 90 fL (ref 80.0–100.0)
Platelets: 324 10*3/uL (ref 150–400)
RBC: 3.79 MIL/uL — ABNORMAL LOW (ref 3.87–5.11)
RDW: 13.1 % (ref 11.5–15.5)
WBC: 13.2 10*3/uL — ABNORMAL HIGH (ref 4.0–10.5)
nRBC: 0 % (ref 0.0–0.2)

## 2020-08-23 LAB — BASIC METABOLIC PANEL
Anion gap: 11 (ref 5–15)
BUN: 20 mg/dL (ref 6–20)
CO2: 24 mmol/L (ref 22–32)
Calcium: 8.7 mg/dL — ABNORMAL LOW (ref 8.9–10.3)
Chloride: 105 mmol/L (ref 98–111)
Creatinine, Ser: 0.81 mg/dL (ref 0.44–1.00)
GFR, Estimated: >60 mL/min (ref >60)
Glucose, Bld: 189 mg/dL — ABNORMAL HIGH (ref 70–99)
Potassium: 3 mmol/L — ABNORMAL LOW (ref 3.5–5.1)
Sodium: 140 mmol/L (ref 135–145)

## 2020-08-23 LAB — TROPONIN I (HIGH SENSITIVITY): Troponin I (High Sensitivity): 6 ng/L (ref <18)

## 2020-08-23 MED ORDER — ONDANSETRON 4 MG PO TBDP: ORAL_TABLET | ORAL | 0 refills | Status: AC

## 2020-08-23 MED ORDER — ONDANSETRON 4 MG PO TBDP
4.0000 mg | ORAL_TABLET | Freq: Once | ORAL | Status: AC
  Administered 2020-08-23: 4 mg via ORAL
  Filled 2020-08-23: qty 1

## 2020-08-23 MED ORDER — POTASSIUM CHLORIDE CRYS ER 20 MEQ PO TBCR: 20.0000 meq | EXTENDED_RELEASE_TABLET | Freq: Every day | ORAL | 0 refills | Status: AC

## 2020-08-23 NOTE — ED Notes (Signed)
Pt alert and sitting up in wheelchair in lobby, asking this RN questions.  Adamantly requesting water.  Pt also reiterates that she is not SI or HI and overdose was not intentional of hurting self.

## 2020-08-23 NOTE — ED Notes (Addendum)
Nani Ravens, [phone] Sister phone number [phone]

## 2020-08-23 NOTE — ED Provider Notes (Signed)
[facility] Emergency Department Provider Note  ____________________________________________   Event Date/Time   First MD Initiated Contact with Patient 08/23/20 2330     (approximate)  I have reviewed the triage vital signs and the nursing notes.   HISTORY  Chief Complaint Drug Overdose    HPI Kendra Lindsey is a 30 y.o. female who presents by EMS for evaluation after an apparent accidental opioid overdose.  The patient is awake, alert, and in no distress other than some persistent nausea and vomiting.  She said that she has been working about 80 hours a week at a very stressful job.  She has some issues with chronic pain and even though she has overcome her addiction and no longer takes Suboxone, she needed something to help her back because she has to stand for some a long hours at work.  This evening she took a pill from a friend that she was told was Roxicodone or Percocet or "something like that".  Now after taking it she passed out and was reportedly not breathing well and unresponsive.  She was given 2 doses of Narcan by EMS and CPR and bagging were initiated prior to the initiation of the Narcan.  She woke up after the Narcan has been awake and alert ever since.  She said that her body hurts all over but not anything in particular.  He is nauseated and vomited while I was speaking with her but said that she felt fine before the episode.  She is adamant and very anxious that I understand that she was not trying to hurt herself, she has a new job and plenty of reasons to work and live, she was just overwhelmed and made the mistake of taking an unidentified pill from someone she trusted.  She did not strike her head or lose consciousness.  She has not had a recent fever.  The onset of the symptoms was acute and severe and Narcan made it better, nothing in particular made it worse.     Past Medical History:  Diagnosis Date   Asthma     There are no  problems to display for this patient.   Past Surgical History:  Procedure Laterality Date   NO PAST SURGERIES      Prior to Admission medications   Medication Sig Start Date End Date Taking? Authorizing Provider  ondansetron (ZOFRAN ODT) 4 MG disintegrating tablet Allow 1-2 tablets to dissolve in your mouth every 8 hours as needed for nausea/vomiting 08/23/20  Yes Loleta Rose, MD  potassium chloride SA (KLOR-CON M20) 20 MEQ tablet Take 1 tablet (20 mEq total) by mouth daily. 08/23/20  Yes Loleta Rose, MD  albuterol (PROVENTIL HFA;VENTOLIN HFA) 108 (90 Base) MCG/ACT inhaler Inhale 1-2 puffs into the lungs every 6 (six) hours as needed for wheezing or shortness of breath. 02/21/18   Tommie Sams, DO  amoxicillin (AMOXIL) 500 MG capsule Take 1 capsule (500 mg total) by mouth 3 (three) times daily. 08/18/19   Menshew, Charlesetta Ivory, PA-C  ketorolac (TORADOL) 10 MG tablet Take 1 tablet (10 mg total) by mouth every 8 (eight) hours. 08/18/19   Menshew, Charlesetta Ivory, PA-C  naproxen (NAPROSYN) 500 MG tablet Take 1 tablet (500 mg total) by mouth 2 (two) times daily with a meal. 07/14/19   Irean Hong, MD    Allergies Patient has no known allergies.  Family History  Problem Relation Age of Onset   Drug abuse Mother  Scoliosis Mother    Mental illness Mother     Social History Social History   Tobacco Use   Smoking status: Every Day    Packs/day: 0.50    Pack years: 0.00    Types: Cigarettes   Smokeless tobacco: Never  Vaping Use   Vaping Use: Never used  Substance Use Topics   Alcohol use: Yes    Comment: socially   Drug use: Yes    Types: Cocaine    Review of Systems Constitutional: No fever/chills Eyes: No visual changes. ENT: No sore throat. Cardiovascular: Aching chest pain after CPR in the field. Respiratory: Denies shortness of breath. Gastrointestinal: Positive for nausea and vomiting.  Denies abdominal pain. Genitourinary: Negative for  dysuria. Musculoskeletal: Negative for neck pain.  Negative for back pain. Integumentary: Negative for rash. Neurological: Negative for headaches, focal weakness or numbness.   ____________________________________________   PHYSICAL EXAM:  VITAL SIGNS: ED Triage Vitals  Enc Vitals Group     BP 08/23/20 2150 (!) 152/99     Pulse Rate 08/23/20 2150 96     Resp 08/23/20 2150 18     Temp 08/23/20 2150 98.6 F (37 C)     Temp Source 08/23/20 2150 Oral     SpO2 08/23/20 2150 97 %     Weight 08/23/20 2151 83.9 kg (185 lb)     Height 08/23/20 2151 1.727 m (5\' 8" )     Head Circumference --      Peak Flow --      Pain Score 08/23/20 2151 7     Pain Loc --      Pain Edu? --      Excl. in GC? --     Constitutional: Alert and oriented.  Eyes: Conjunctivae are normal.  Head: Atraumatic. Nose: No congestion/rhinnorhea. Mouth/Throat: Patient is wearing a mask. Neck: No stridor.  No meningeal signs.   Cardiovascular: Normal rate, regular rhythm. Good peripheral circulation. Respiratory: Normal respiratory effort.  No retractions. Gastrointestinal: Soft and nontender. No distention.  Musculoskeletal: No lower extremity tenderness nor edema. No gross deformities of extremities. Neurologic:  Normal speech and language. No gross focal neurologic deficits are appreciated.  Skin:  Skin is warm, dry and intact. Psychiatric: Mood and affect are anxious but generally appropriate and circumstances.  Adamantly denies suicidal ideation and homicidal ideation.  He is forward thinking and goal oriented, has good insight into the situation even though she demonstrated poor judgment earlier.  ____________________________________________   LABS (all labs ordered are listed, but only abnormal results are displayed)  Labs Reviewed  BASIC METABOLIC PANEL - Abnormal; Notable for the following components:      Result Value   Potassium 3.0 (*)    Glucose, Bld 189 (*)    Calcium 8.7 (*)    All other  components within normal limits  CBC - Abnormal; Notable for the following components:   WBC 13.2 (*)    RBC 3.79 (*)    HCT 34.1 (*)    All other components within normal limits  POC URINE PREG, ED  TROPONIN I (HIGH SENSITIVITY)  TROPONIN I (HIGH SENSITIVITY)   ____________________________________________  EKG  ED ECG REPORT I, 2152, the attending physician, personally viewed and interpreted this ECG.  Date: 08/23/2020 EKG Time: 22: 01 Rate: 74 Rhythm: normal sinus rhythm QRS Axis: normal Intervals: Borderline LVH ST/T Wave abnormalities: Non-specific ST segment / T-wave changes, but no clear evidence of acute ischemia. Narrative Interpretation: no definitive evidence of acute ischemia;  does not meet STEMI criteria.  ____________________________________________  RADIOLOGY I, Loleta Rose, personally viewed and evaluated these images (plain radiographs) as part of my medical decision making, as well as reviewing the written report by the radiologist.  ED MD interpretation: No acute abnormality on chest x-ray  Official radiology report(s): DG Chest 2 View  Result Date: 08/23/2020 CLINICAL DATA:  Chest pain EXAM: CHEST - 2 VIEW COMPARISON:  11/06/2017 FINDINGS: The heart size and mediastinal contours are within normal limits. Both lungs are clear. Scoliosis of the spine. IMPRESSION: No active cardiopulmonary disease. Electronically Signed   By: Jasmine Pang M.D.   On: 08/23/2020 22:34    ____________________________________________   INITIAL IMPRESSION / MDM / ASSESSMENT AND PLAN / ED COURSE  As part of my medical decision making, I reviewed the following data within the electronic MEDICAL RECORD NUMBER Nursing notes reviewed and incorporated, Labs reviewed , EKG interpreted , Old chart reviewed, Radiograph reviewed , Notes from prior ED visits, and  Controlled Substance Database   Differential diagnosis includes, but is not limited to, accidental drug overdose,  intentional drug overdose, nonspecific cardiogenic syncope, acute infection, electrolyte or metabolic abnormality.  Nonischemic EKG.  Reassuring vital signs.  Patient is awake and alert in no distress other than nausea and vomiting for which I provided Zofran 4 mg ODT.  She is very insistent that she leave.  I believe her that this was an accident and that she is embarrassed and has no intention to kill herself.  We talked about how dangerous it is to take a pill from a friend.  I explained that typically we would watch similar for a couple of more hours after requiring Narcan and I have asked her to sign out AMA, but at this point I do not feel that she meets involuntary commitment criteria nor the need for inpatient treatment.  There is no indication of an emergent medical condition at this time even though a little bit longer period of observation would be appropriate, but she does not appropriate to put her under involuntary commitment when she shows no signs of acute impairment and has the capacity to make her own decisions.  Her lab work is generally reassuring with a mild leukocytosis of 13.2, normal high-sensitivity troponin, and a metabolic panel that is normal except for potassium of 3.0.  Given that she is actively vomiting I will not try to give her a potassium supplement but I wrote prescriptions for potassium supplement and Zofran.  I encouraged outpatient follow-up and gave her some resources and gave strict return precautions to the ED.  She understands and agrees with the plan.           ____________________________________________  FINAL CLINICAL IMPRESSION(S) / ED DIAGNOSES  Final diagnoses:  Opioid overdose, accidental or unintentional, initial encounter (HCC)  Hypokalemia     MEDICATIONS GIVEN DURING THIS VISIT:  Medications  ondansetron (ZOFRAN-ODT) disintegrating tablet 4 mg (4 mg Oral Given 08/23/20 2344)     ED Discharge Orders          Ordered    ondansetron  (ZOFRAN ODT) 4 MG disintegrating tablet        08/23/20 2339    potassium chloride SA (KLOR-CON M20) 20 MEQ tablet  Daily        08/23/20 2339             Note:  This document was prepared using Dragon voice recognition software and may include unintentional dictation errors.   Loleta Rose,  MD 08/24/20 [id number]

## 2020-08-23 NOTE — ED Triage Notes (Signed)
Pt brought in via ems a friends house.  Pt took a Investment banker, operational.  Pt reports she woke up in the back of ambulance and was bagged and did cpr on her.  Pt states her chest is sore and pt has a headache.  Pt alert  speech clear. Iv in place.  Narcan given by ems.

## 2020-08-23 NOTE — ED Notes (Signed)
First nurse note: Patient to ED via EMS after taking 1 percocet this afternoon, accidental overdose, was bagged by EMS approx 8-10 minutes before becoming A&Ox4.  Pt denies SI/HI.

## 2020-08-23 NOTE — ED Notes (Signed)
Patient up multiple times to first nurse desk wanting to leave, demanding IV be taken out.  First nurse informed Dr. York Cerise who is talking to patient in triage 3.

## 2020-08-23 NOTE — Discharge Instructions (Addendum)
You have been seen in the Emergency Department (ED) today for accidental opioid overdose.   You have been provided with some resources that may help, and if you have any outpatient physicians are therapist, they may also help provide additional resources.  Please take any prescriptions that have been provided as indicated on the instructions.  Please return to the ED immediately if you have ANY thoughts of hurting yourself or anyone else, so that we may help you.  Follow up with your doctor and/or therapist as soon as possible regarding today's ED visit.   Please follow up any other recommendations and clinic appointments provided by the psychiatry team that saw you in the Emergency Department.  Please contact [facility] for additional mental health/psychiatric assistance:  [facility] [address] [place], [place] [address] Phone:  [phone] or [phone]  Open Access:   Walk-in ASSESSMENT hours, M-W-F, 8:00am - 3:00pm Advanced Acess CRISIS:  M-F, 8:00am - 8:00pm Outpatient Services Office Hours:  M-F, 8:00am - 5:00pm

## 2022-08-07 ENCOUNTER — Emergency Department
Admission: EM | Admit: 2022-08-07 | Discharge: 2022-08-08 | Disposition: A | Discharge status: Home / Self Care | Payer: Self-pay | Attending: Emergency Medicine | Admitting: Emergency Medicine

## 2022-08-07 ENCOUNTER — Other Ambulatory Visit: Payer: Self-pay

## 2022-08-07 DIAGNOSIS — K0889 Other specified disorders of teeth and supporting structures: Secondary | ICD-10-CM | POA: Insufficient documentation

## 2022-08-07 MED ORDER — SODIUM CHLORIDE 0.9 % IV BOLUS
1000.0000 mL | Freq: Once | INTRAVENOUS | Status: AC
  Administered 2022-08-07: 1000 mL via INTRAVENOUS

## 2022-08-07 MED ORDER — SODIUM CHLORIDE 0.9 % IV SOLN
3.0000 g | Freq: Once | INTRAVENOUS | Status: AC
  Administered 2022-08-07: 3 g via INTRAVENOUS
  Filled 2022-08-07: qty 8

## 2022-08-07 MED ORDER — KETOROLAC TROMETHAMINE 15 MG/ML IJ SOLN
15.0000 mg | Freq: Once | INTRAMUSCULAR | Status: AC
  Administered 2022-08-07: 15 mg via INTRAVENOUS
  Filled 2022-08-07: qty 1

## 2022-08-07 MED ORDER — AMOXICILLIN-POT CLAVULANATE 875-125 MG PO TABS: 1.0000 | ORAL_TABLET | Freq: Two times a day (BID) | ORAL | 0 refills | Status: AC

## 2022-08-07 NOTE — ED Notes (Signed)
Pt came in due to dental pain. Patient mouth was hurting last night and started swelling parts of pt face. Has abscess to L upper gum. L side of face swollen.
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
## 2022-08-07 NOTE — Discharge Instructions (Addendum)
Take Augmentin twice daily for ten days.  

## 2022-08-08 MED ORDER — IBUPROFEN 800 MG PO TABS: 800.0000 mg | ORAL_TABLET | Freq: Three times a day (TID) | ORAL | 0 refills | Status: AC | PRN

## 2024-03-09 DIAGNOSIS — Z5329 Procedure and treatment not carried out because of patient's decision for other reasons: Secondary | ICD-10-CM | POA: Diagnosis not present

## 2024-03-09 DIAGNOSIS — T7421XA Adult sexual abuse, confirmed, initial encounter: Secondary | ICD-10-CM | POA: Diagnosis not present

## 2024-03-09 DIAGNOSIS — R102 Pelvic and perineal pain unspecified side: Secondary | ICD-10-CM | POA: Diagnosis present

## 2024-03-09 DIAGNOSIS — Z0441 Encounter for examination and observation following alleged adult rape: Secondary | ICD-10-CM | POA: Diagnosis not present

## 2024-03-10 ENCOUNTER — Ambulatory Visit
Admission: EM | Admit: 2024-03-10 | Discharge: 2024-03-10 | Disposition: A | Discharge status: Home / Self Care | Source: Ambulatory Visit | Attending: Emergency Medicine | Admitting: Emergency Medicine

## 2024-03-10 ENCOUNTER — Emergency Department
Admission: EM | Admit: 2024-03-10 | Discharge: 2024-03-10 | Disposition: A | Attending: Emergency Medicine | Admitting: Emergency Medicine

## 2024-03-10 ENCOUNTER — Encounter: Payer: Self-pay | Admitting: Emergency Medicine

## 2024-03-10 ENCOUNTER — Other Ambulatory Visit: Payer: Self-pay

## 2024-03-10 DIAGNOSIS — T7421XA Adult sexual abuse, confirmed, initial encounter: Secondary | ICD-10-CM

## 2024-03-10 DIAGNOSIS — Z0441 Encounter for examination and observation following alleged adult rape: Secondary | ICD-10-CM | POA: Diagnosis present

## 2024-03-10 LAB — POC URINE PREG, ED: Preg Test, Ur: NEGATIVE

## 2024-03-10 MED ORDER — METRONIDAZOLE 500 MG PO TABS
2000.0000 mg | ORAL_TABLET | Freq: Once | ORAL | Status: DC
  Filled 2024-03-10: qty 4

## 2024-03-10 MED ORDER — LIDOCAINE HCL (PF) 1 % IJ SOLN
1.0000 mL | Freq: Once | INTRAMUSCULAR | Status: AC
  Administered 2024-03-10: 2 mL
  Filled 2024-03-10: qty 5

## 2024-03-10 MED ORDER — AZITHROMYCIN 500 MG PO TABS
1000.0000 mg | ORAL_TABLET | Freq: Once | ORAL | Status: AC
  Administered 2024-03-10: 1000 mg via ORAL
  Filled 2024-03-10: qty 2

## 2024-03-10 MED ORDER — CEFTRIAXONE SODIUM 1 G IJ SOLR
500.0000 mg | Freq: Once | INTRAMUSCULAR | Status: AC
  Administered 2024-03-10: 500 mg via INTRAMUSCULAR
  Filled 2024-03-10: qty 10
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
## 2024-03-10 NOTE — ED Notes (Addendum)
SANE nurse contacted
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
